# Patient Record
Sex: Female | Born: 1995 | Hispanic: Yes | Marital: Married | Smoking: Former smoker
Health system: Southern US, Community
[De-identification: ages and names within clinical notes are randomized; demographics above are authoritative.]

## PROBLEM LIST (undated history)

## (undated) DIAGNOSIS — F419 Anxiety disorder, unspecified: Secondary | ICD-10-CM

## (undated) HISTORY — PX: UMBILICAL HERNIA REPAIR: SHX196

---

## 2018-01-30 ENCOUNTER — Other Ambulatory Visit: Payer: Self-pay

## 2018-01-30 ENCOUNTER — Emergency Department (HOSPITAL_COMMUNITY)
Admission: EM | Admit: 2018-01-30 | Discharge: 2018-01-30 | Disposition: A | Discharge status: Home / Self Care | Payer: Self-pay | Attending: Emergency Medicine | Admitting: Emergency Medicine

## 2018-01-30 ENCOUNTER — Encounter (HOSPITAL_COMMUNITY): Payer: Self-pay | Admitting: Emergency Medicine

## 2018-01-30 DIAGNOSIS — F17210 Nicotine dependence, cigarettes, uncomplicated: Secondary | ICD-10-CM | POA: Insufficient documentation

## 2018-01-30 DIAGNOSIS — R200 Anesthesia of skin: Secondary | ICD-10-CM | POA: Insufficient documentation

## 2018-01-30 DIAGNOSIS — M25511 Pain in right shoulder: Secondary | ICD-10-CM | POA: Insufficient documentation

## 2018-01-30 DIAGNOSIS — M436 Torticollis: Secondary | ICD-10-CM | POA: Insufficient documentation

## 2018-01-30 MED ORDER — IBUPROFEN 800 MG PO TABS
800.0000 mg | ORAL_TABLET | Freq: Once | ORAL | Status: AC
  Administered 2018-01-30: 800 mg via ORAL
  Filled 2018-01-30: qty 1

## 2018-01-30 MED ORDER — TRAMADOL HCL 50 MG PO TABS: 50.0000 mg | ORAL_TABLET | Freq: Four times a day (QID) | ORAL | 0 refills | Status: DC | PRN

## 2018-01-30 MED ORDER — CYCLOBENZAPRINE HCL 10 MG PO TABS: 10.0000 mg | ORAL_TABLET | Freq: Three times a day (TID) | ORAL | 0 refills | Status: DC | PRN

## 2018-01-30 MED ORDER — CYCLOBENZAPRINE HCL 10 MG PO TABS
10.0000 mg | ORAL_TABLET | Freq: Once | ORAL | Status: AC
Start: 2018-01-30 — End: 2018-01-30
  Administered 2018-01-30: 10 mg via ORAL
  Filled 2018-01-30: qty 1

## 2018-01-30 NOTE — ED Provider Notes (Signed)
[facility]-EMERGENCY DEPT Provider Note   CSN: [id number] Arrival date & time: 01/30/18  0103     History   Chief Complaint Chief Complaint  Patient presents with  . Neck Pain    HPI Stacey Tapia is a 22 y.o. female.  The history is provided by the patient. A language interpreter was used.  She complains of pain in the right side of her neck for the last week.  Pain radiates to the right shoulder and there is some numbness in the proximal right upper arm.  Pain has been getting worse.  She rates pain at 5/10.  Nothing makes it better, nothing makes it worse.  She denies any weakness.  She denies any trauma or unusual activity.  She is never had anything like this before.  She has taken p.o. scopolamine with acetaminophen, but got no relief with this.  History reviewed. No pertinent past medical history.  There are no active problems to display for this patient.   History reviewed. No pertinent surgical history.   OB History   None      Home Medications    Prior to Admission medications   Not on File    Family History History reviewed. No pertinent family history.  Social History Social History   Tobacco Use  . Smoking status: Current Some Day Smoker    Packs/day: 0.25    Types: Cigarettes  . Smokeless tobacco: Never Used  Substance Use Topics  . Alcohol use: Never    Frequency: Never  . Drug use: Never     Allergies   Cough syrup [guaifenesin]   Review of Systems Review of Systems  All other systems reviewed and are negative.    Physical Exam Updated Vital Signs BP 103/75   Pulse 71   Temp 98.7 F (37.1 C) (Oral)   Resp 18   Ht 5' 4.57" (1.64 m)   Wt 74.8 kg (165 lb)   LMP 01/16/2018   SpO2 100%   BMI 27.83 kg/m   Physical Exam  Nursing note and vitals reviewed.  22 year old female, resting comfortably and in no acute distress. Vital signs are normal. Oxygen saturation is 100%, which is normal. Head is  normocephalic and atraumatic. PERRLA, EOMI. Oropharynx is clear. Neck has significant spasm of the right paracervical muscles with tenderness at the same area.  There is no meningismus.  Full range of motion is present.  There is no adenopathy or JVD. Back is nontender and there is no CVA tenderness. Lungs are clear without rales, wheezes, or rhonchi. Chest is nontender. Heart has regular rate and rhythm without murmur. Abdomen is soft, flat, nontender without masses or hepatosplenomegaly and peristalsis is normoactive. Extremities have no cyanosis or edema, full range of motion is present. Skin is warm and dry without rash. Neurologic: Mental status is normal, cranial nerves are intact, there are no motor or sensory deficits.  Strength is 5/5 in all muscles of the right arm.  ED Treatments / Results   Procedures Procedures (including critical care time)  Medications Ordered in ED Medications  ibuprofen (ADVIL,MOTRIN) tablet 800 mg (has no administration in time range)  cyclobenzaprine (FLEXERIL) tablet 10 mg (has no administration in time range)     Initial Impression / Assessment and Plan / ED Course  I have reviewed the triage vital signs and the nursing notes.  Right-sided neck pain consistent with torticollis.  No red flags to suggest more serious pathology.  She is discharged  with prescriptions for tramadol and cyclobenzaprine, recommended she use over-the-counter NSAIDs as needed.  Return precautions discussed.  Final Clinical Impressions(s) / ED Diagnoses   Final diagnoses:  Torticollis, acute    ED Discharge Orders        Ordered    cyclobenzaprine (FLEXERIL) 10 MG tablet  3 times daily PRN     01/30/18 0351    traMADol (ULTRAM) 50 MG tablet  Every 6 hours PRN     01/30/18 0351       Dione Booze, MD 01/30/18 [phone]

## 2018-01-30 NOTE — Discharge Instructions (Addendum)
Aplicar varias veces al da.  [employer] dos tabletas de naproxeno (Aleve) dos veces al da.  Alternativamente, tome tres ibuprofeno (Advil, Motrin) cuatro veces al [employer].

## 2018-01-30 NOTE — ED Triage Notes (Signed)
Pt family reports that she has been having pain right side of neck and shooting pain into right arm for the last week. Pt also reporting swelling to right hand.

## 2021-04-20 ENCOUNTER — Emergency Department (HOSPITAL_COMMUNITY)
Admission: EM | Admit: 2021-04-20 | Discharge: 2021-04-20 | Disposition: A | Discharge status: Home / Self Care | Payer: Self-pay | Attending: Emergency Medicine | Admitting: Emergency Medicine

## 2021-04-20 ENCOUNTER — Encounter (HOSPITAL_COMMUNITY): Payer: Self-pay | Admitting: *Deleted

## 2021-04-20 ENCOUNTER — Emergency Department (HOSPITAL_COMMUNITY): Payer: Self-pay

## 2021-04-20 ENCOUNTER — Other Ambulatory Visit: Payer: Self-pay

## 2021-04-20 DIAGNOSIS — R059 Cough, unspecified: Secondary | ICD-10-CM | POA: Insufficient documentation

## 2021-04-20 DIAGNOSIS — Z5321 Procedure and treatment not carried out due to patient leaving prior to being seen by health care provider: Secondary | ICD-10-CM | POA: Insufficient documentation

## 2021-04-20 DIAGNOSIS — F17210 Nicotine dependence, cigarettes, uncomplicated: Secondary | ICD-10-CM | POA: Insufficient documentation

## 2021-04-20 DIAGNOSIS — R202 Paresthesia of skin: Secondary | ICD-10-CM | POA: Insufficient documentation

## 2021-04-20 DIAGNOSIS — R0981 Nasal congestion: Secondary | ICD-10-CM | POA: Insufficient documentation

## 2021-04-20 DIAGNOSIS — F419 Anxiety disorder, unspecified: Secondary | ICD-10-CM | POA: Insufficient documentation

## 2021-04-20 DIAGNOSIS — R290 Tetany: Secondary | ICD-10-CM

## 2021-04-20 DIAGNOSIS — R064 Hyperventilation: Secondary | ICD-10-CM | POA: Insufficient documentation

## 2021-04-20 DIAGNOSIS — R0602 Shortness of breath: Secondary | ICD-10-CM | POA: Insufficient documentation

## 2021-04-20 DIAGNOSIS — R252 Cramp and spasm: Secondary | ICD-10-CM | POA: Insufficient documentation

## 2021-04-20 LAB — CBC WITH DIFFERENTIAL/PLATELET
Abs Immature Granulocytes: 0 10*3/uL (ref 0.00–0.07)
Basophils Absolute: 0 10*3/uL (ref 0.0–0.1)
Basophils Relative: 0 %
Eosinophils Absolute: 0 10*3/uL (ref 0.0–0.5)
Eosinophils Relative: 0 %
HCT: 41.2 % (ref 36.0–46.0)
Hemoglobin: 13.6 g/dL (ref 12.0–15.0)
Immature Granulocytes: 0 %
Lymphocytes Relative: 56 %
Lymphs Abs: 3.7 10*3/uL (ref 0.7–4.0)
MCH: 29.6 pg (ref 26.0–34.0)
MCHC: 33 g/dL (ref 30.0–36.0)
MCV: 89.8 fL (ref 80.0–100.0)
Monocytes Absolute: 0.6 10*3/uL (ref 0.1–1.0)
Monocytes Relative: 10 %
Neutro Abs: 2.2 10*3/uL (ref 1.7–7.7)
Neutrophils Relative %: 34 %
Platelets: 222 10*3/uL (ref 150–400)
RBC: 4.59 MIL/uL (ref 3.87–5.11)
RDW: 13 % (ref 11.5–15.5)
WBC: 6.5 10*3/uL (ref 4.0–10.5)
nRBC: 0 % (ref 0.0–0.2)

## 2021-04-20 LAB — COMPREHENSIVE METABOLIC PANEL
ALT: 39 U/L (ref 0–44)
AST: 42 U/L — ABNORMAL HIGH (ref 15–41)
Albumin: 3.9 g/dL (ref 3.5–5.0)
Alkaline Phosphatase: 42 U/L (ref 38–126)
Anion gap: 5 (ref 5–15)
BUN: 19 mg/dL (ref 6–20)
CO2: 24 mmol/L (ref 22–32)
Calcium: 8.6 mg/dL — ABNORMAL LOW (ref 8.9–10.3)
Chloride: 109 mmol/L (ref 98–111)
Creatinine, Ser: 0.51 mg/dL (ref 0.44–1.00)
GFR, Estimated: >60 mL/min (ref >60)
Glucose, Bld: 90 mg/dL (ref 70–99)
Potassium: 4 mmol/L (ref 3.5–5.1)
Sodium: 138 mmol/L (ref 135–145)
Total Bilirubin: 0.2 mg/dL — ABNORMAL LOW (ref 0.3–1.2)
Total Protein: 6.7 g/dL (ref 6.5–8.1)

## 2021-04-20 LAB — I-STAT BETA HCG BLOOD, ED (MC, WL, AP ONLY): I-stat hCG, quantitative: <5 m[IU]/mL (ref <5)

## 2021-04-20 LAB — CK: Total CK: 106 U/L (ref 38–234)

## 2021-04-20 MED ORDER — IBUPROFEN 200 MG PO TABS
400.0000 mg | ORAL_TABLET | Freq: Once | ORAL | Status: AC
  Administered 2021-04-20: 06:00:00 400 mg via ORAL
  Filled 2021-04-20: qty 2

## 2021-04-20 NOTE — ED Triage Notes (Signed)
The pt is breathing 40 times a minute still she does not think its her breathing her fingers are still all cramped up  she reports that she cannot feel her face or the rest of her body

## 2021-04-20 NOTE — ED Notes (Signed)
The female with the pt was asking how long before they see a doctor. He  Reports that the pt has been breathing fast for one hour.  I decided to draw blood on her although she had no pain anywhere.   When she was called the female had left with her before the blood was drawn

## 2021-04-20 NOTE — ED Triage Notes (Signed)
Pt came in with c/o tachypnea (at home and [facility]), hand, and feet cramping. Pt's hands and feet appear tense. Pt was seen at [facility] previously and they educated her to slow her breathing down. Pt's RR rate back to normal but c/o pain

## 2021-04-20 NOTE — ED Provider Notes (Signed)
[facility]-EMERGENCY DEPT Provider Note   CSN: [id number] Arrival date & time: 04/20/21  0151     History Chief Complaint  Patient presents with   Anxiety   Hand Pain    Stacey Tapia is a 25 y.o. female.  The history is provided by the patient. No language interpreter was used.  Anxiety Associated symptoms include shortness of breath.  Hand Pain Associated symptoms include shortness of breath. Patient presents for multiple complaints. Patient speaks English and declines language interpreter Patient reports that she was recently diagnosed with COVID over 4 days ago.  She reports recent cough and congestion.  She reports earlier night, she started feeling short of breath and breathing very fast.  She then reported numbness throughout her hands and into her face.  She reports her hands both began to cramp.  She went to [facility], because she was not seen immediately they left that hospital and came immediately to [facility] She is now beginning to improve.  She does report feeling anxious.     PMH-none OB History   No obstetric history on file.     No family history on file.  Social History   Tobacco Use   Smoking status: Some Days    Packs/day: 0.25    Pack years: 0.00    Types: Cigarettes   Smokeless tobacco: Never  Substance Use Topics   Alcohol use: Never   Drug use: Never    Home Medications Prior to Admission medications   Medication Sig Start Date End Date Taking? Authorizing Provider  cyclobenzaprine (FLEXERIL) 10 MG tablet Take 1 tablet (10 mg total) by mouth 3 (three) times daily as needed for muscle spasms. 01/30/18   Dione Booze, MD  traMADol (ULTRAM) 50 MG tablet Take 1 tablet (50 mg total) by mouth every 6 (six) hours as needed. 01/30/18   Dione Booze, MD    Allergies    Cough syrup [guaifenesin]  Review of Systems   Review of Systems  Constitutional:  Negative for fever.  Respiratory:  Positive  for cough and shortness of breath.   Musculoskeletal:  Positive for myalgias.  Psychiatric/Behavioral:  The patient is nervous/anxious.   All other systems reviewed and are negative.  Physical Exam Updated Vital Signs BP 98/84   Pulse (!) 54   Temp 97.6 F (36.4 C) (Oral)   Resp 15   Ht 1.626 m (5\' 4" )   Wt 74.4 kg   SpO2 100%   BMI 28.15 kg/m   Physical Exam CONSTITUTIONAL: Well developed/well nourished, mildly anxious HEAD: Normocephalic/atraumatic EYES: EOMI/PERRL ENMT: Mucous membranes moist NECK: supple no meningeal signs SPINE/BACK:entire spine nontender CV: S1/S2 noted, no murmurs/rubs/gallops noted LUNGS: Mild tachypnea, no acute distress noted, no crackles ABDOMEN: soft, nontender NEURO: Pt is awake/alert/appropriate, moves all extremitiesx4.  No facial droop.  No arm or leg drift.  No tremors are noted.  No facial spasms noted EXTREMITIES: pulses normal/equal, full ROM, patient has left hand with a closed fist, I'm able to open her hand without difficulty Left thigh is mildly tender to palpation, but no bruising or crepitus.  Distal pulses equal and intact.  No calf tenderness.  Female chaperone present for thigh exam SKIN: warm, color normal PSYCH: Mildly anxious  ED Results / Procedures / Treatments   Labs (all labs ordered are listed, but only abnormal results are displayed) Labs Reviewed  COMPREHENSIVE METABOLIC PANEL - Abnormal; Notable for the following components:      Result  Value   Calcium 8.6 (*)    AST 42 (*)    Total Bilirubin 0.2 (*)    All other components within normal limits  CBC WITH DIFFERENTIAL/PLATELET  CK  I-STAT BETA HCG BLOOD, ED ([facility], [facility], [facility] ONLY)    EKG EKG Interpretation  Date/Time:  Monday April 20 2021 04:03:10 EDT Ventricular Rate:  57 PR Interval:  130 QRS Duration: 90 QT Interval:  418 QTC Calculation: 407 R Axis:   75 Text Interpretation: Sinus rhythm No previous ECGs available Confirmed by Zadie Rhine ([id number]) on  04/20/2021 4:16:52 AM  Radiology DG Chest Port 1 View  Result Date: 04/20/2021 CLINICAL DATA:  Tachypnea EXAM: PORTABLE CHEST 1 VIEW COMPARISON:  None. FINDINGS: Normal heart size and mediastinal contours. No acute infiltrate or edema. No effusion or pneumothorax. No acute osseous findings. IMPRESSION: Negative chest. Electronically Signed   By: Marnee Spring M.D.   On: 04/20/2021 04:44    Procedures Procedures   Medications Ordered in ED Medications  ibuprofen (ADVIL) tablet 400 mg (400 mg Oral Given 04/20/21 0539)    ED Course  I have reviewed the triage vital signs and the nursing notes.  Pertinent labs & imaging results that were available during my care of the patient were reviewed by me and considered in my medical decision making (see chart for details).    MDM Rules/Calculators/A&P                          Patient reports she started feeling shortness of breath at home and thought it was due to COVID.  Apparently she was hyperventilating, and reported numbness throughout her face and had cramping and spasms in both hands.  By the time she reached this hospital, things were improving.  She also reported pain and cramping in the left thigh.  Overall patient appears improved.  Labs are not significantly deranged, potassium and calcium around the normal range.  Suspect spasms and cramping are due to recent hyperventilation and will likely resolve in next several hours.  Patient is ambulatory.  Overall appears appropriate for discharge home Final Clinical Impression(s) / ED Diagnoses Final diagnoses:  Shortness of breath  Carpopedal spasm    Rx / DC Orders ED Discharge Orders     None        Zadie Rhine, MD 04/20/21 [phone]

## 2021-04-20 NOTE — ED Notes (Signed)
PT WANTED MEDICINE AND CANNOT RECEIVE THOSE IN TRIAGE SO PT LEFT.

## 2021-04-20 NOTE — ED Triage Notes (Signed)
The pt is hyperventilating there is a female with her hthat may be causing her panic attack  she has cramps in her finfers and she has been breathing like this for at least 30 minutes. Crying

## 2021-05-15 ENCOUNTER — Inpatient Hospital Stay (HOSPITAL_COMMUNITY): Payer: Self-pay

## 2021-05-15 ENCOUNTER — Encounter (HOSPITAL_COMMUNITY): Payer: Self-pay | Admitting: Obstetrics and Gynecology

## 2021-05-15 ENCOUNTER — Other Ambulatory Visit: Payer: Self-pay

## 2021-05-15 ENCOUNTER — Inpatient Hospital Stay (HOSPITAL_COMMUNITY)
Admission: AD | Admit: 2021-05-15 | Discharge: 2021-05-15 | Disposition: A | Discharge status: Home / Self Care | Payer: Self-pay | Attending: Obstetrics and Gynecology | Admitting: Obstetrics and Gynecology

## 2021-05-15 DIAGNOSIS — O209 Hemorrhage in early pregnancy, unspecified: Secondary | ICD-10-CM | POA: Insufficient documentation

## 2021-05-15 DIAGNOSIS — Z3A01 Less than 8 weeks gestation of pregnancy: Secondary | ICD-10-CM | POA: Insufficient documentation

## 2021-05-15 DIAGNOSIS — Z3490 Encounter for supervision of normal pregnancy, unspecified, unspecified trimester: Secondary | ICD-10-CM

## 2021-05-15 DIAGNOSIS — O210 Mild hyperemesis gravidarum: Secondary | ICD-10-CM

## 2021-05-15 DIAGNOSIS — O4691 Antepartum hemorrhage, unspecified, first trimester: Secondary | ICD-10-CM

## 2021-05-15 DIAGNOSIS — O99331 Smoking (tobacco) complicating pregnancy, first trimester: Secondary | ICD-10-CM | POA: Insufficient documentation

## 2021-05-15 DIAGNOSIS — R1030 Lower abdominal pain, unspecified: Secondary | ICD-10-CM | POA: Insufficient documentation

## 2021-05-15 DIAGNOSIS — O219 Vomiting of pregnancy, unspecified: Secondary | ICD-10-CM | POA: Insufficient documentation

## 2021-05-15 DIAGNOSIS — O26891 Other specified pregnancy related conditions, first trimester: Secondary | ICD-10-CM | POA: Insufficient documentation

## 2021-05-15 DIAGNOSIS — F17210 Nicotine dependence, cigarettes, uncomplicated: Secondary | ICD-10-CM | POA: Insufficient documentation

## 2021-05-15 HISTORY — DX: Anxiety disorder, unspecified: F41.9

## 2021-05-15 LAB — COMPREHENSIVE METABOLIC PANEL
ALT: 21 U/L (ref 0–44)
AST: 19 U/L (ref 15–41)
Albumin: 3.7 g/dL (ref 3.5–5.0)
Alkaline Phosphatase: 40 U/L (ref 38–126)
Anion gap: 7 (ref 5–15)
BUN: 12 mg/dL (ref 6–20)
CO2: 25 mmol/L (ref 22–32)
Calcium: 8.9 mg/dL (ref 8.9–10.3)
Chloride: 103 mmol/L (ref 98–111)
Creatinine, Ser: 0.62 mg/dL (ref 0.44–1.00)
GFR, Estimated: >60 mL/min (ref >60)
Glucose, Bld: 84 mg/dL (ref 70–99)
Potassium: 3.9 mmol/L (ref 3.5–5.1)
Sodium: 135 mmol/L (ref 135–145)
Total Bilirubin: 0.8 mg/dL (ref 0.3–1.2)
Total Protein: 6.4 g/dL — ABNORMAL LOW (ref 6.5–8.1)

## 2021-05-15 LAB — CBC
HCT: 39 % (ref 36.0–46.0)
Hemoglobin: 13 g/dL (ref 12.0–15.0)
MCH: 30 pg (ref 26.0–34.0)
MCHC: 33.3 g/dL (ref 30.0–36.0)
MCV: 89.9 fL (ref 80.0–100.0)
Platelets: 263 10*3/uL (ref 150–400)
RBC: 4.34 MIL/uL (ref 3.87–5.11)
RDW: 12.8 % (ref 11.5–15.5)
WBC: 9.1 10*3/uL (ref 4.0–10.5)
nRBC: 0 % (ref 0.0–0.2)

## 2021-05-15 LAB — URINALYSIS, ROUTINE W REFLEX MICROSCOPIC
Bilirubin Urine: NEGATIVE
Glucose, UA: NEGATIVE mg/dL
Hgb urine dipstick: NEGATIVE
Ketones, ur: 20 mg/dL — AB
Leukocytes,Ua: NEGATIVE
Nitrite: NEGATIVE
Protein, ur: NEGATIVE mg/dL
Specific Gravity, Urine: 1.013 (ref 1.005–1.030)
pH: 7 (ref 5.0–8.0)

## 2021-05-15 LAB — HCG, QUANTITATIVE, PREGNANCY: hCG, Beta Chain, Quant, S: 63263 m[IU]/mL — ABNORMAL HIGH (ref <5)

## 2021-05-15 MED ORDER — PROMETHAZINE HCL 12.5 MG PO TABS: 12.5000 mg | ORAL_TABLET | Freq: Four times a day (QID) | ORAL | 0 refills | Status: DC | PRN

## 2021-05-15 NOTE — MAU Note (Signed)
Stacey Tapia is a 25 y.o. at [redacted]w[redacted]d here in MAU reporting: lower abdominal pain and a little bit of vaginal bleeding. Both started on Wednesday. Bleeding is now brown.  Pt had pregnancy confirmed at [facility] and has paperwork with her.  LMP: 03/04/21, has irregular periods  Onset of complaint: ongoing  Pain score: 5/10  Vitals:   05/15/21 1008  BP: 126/60  Pulse: 87  Resp: 16  Temp: 98.9 F (37.2 C)  SpO2: 99%     Lab orders placed from triage: UA

## 2021-05-15 NOTE — Discharge Instructions (Signed)

## 2021-05-15 NOTE — MAU Provider Note (Signed)
History     CSN: [id number]  Arrival date and time: 05/15/21 0955   Event Date/Time   First Provider Initiated Contact with Patient 05/15/21 1026      Chief Complaint  Patient presents with   Abdominal Pain   Vaginal Bleeding   HPI Stacey Tapia is a 25 y.o. G1P0 in early pregnancy who presents to MAU with chief complaints of lower abdominal pain and vaginal spotting. These are new problems, onset one week ago.. Patient's pain is across her entire lower abdomen. Pain score is 5/10. She denies aggravating or alleviating factors. She has not taken medication or tried other treatments for this complaint. She declines medication during MAU evaluation.  Patient's spotting is very light. She denies bright red or heavy vaginal bleeding. She visualizes the spotting when she wipes after voiding. She denies dysuria, abdominal tenderness, fever. She is remote from sexual intercourse.  Patient endorses nausea and with occasional vomiting. She is able to tolerate PO intake.   Patient is planning care with [facility].  OB History     Gravida  1   Para      Term      Preterm      AB      Living         SAB      IAB      Ectopic      Multiple      Live Births              Past Medical History:  Diagnosis Date   Anxiety     Past Surgical History:  Procedure Laterality Date   UMBILICAL HERNIA REPAIR      History reviewed. No pertinent family history.  Social History   Tobacco Use   Smoking status: Some Days    Packs/day: 0.25    Pack years: 0.00    Types: Cigarettes   Smokeless tobacco: Never  Substance Use Topics   Alcohol use: Never   Drug use: Never    Allergies:  Allergies  Allergen Reactions   Cough Syrup [Guaifenesin]     Tachycardia, actual medication was Tox-Sol    No medications prior to admission.    Review of Systems  Gastrointestinal:  Positive for abdominal pain.  Genitourinary:  Positive for vaginal bleeding.  All other systems  reviewed and are negative. Physical Exam   Blood pressure 126/60, pulse 87, temperature 98.9 F (37.2 C), temperature source Oral, resp. rate 16, height 5\' 4"  (1.626 m), weight 49.4 kg, last menstrual period 03/04/2021, SpO2 99 %.  Physical Exam Vitals and nursing note reviewed. Exam conducted with a chaperone present.  Constitutional:      General: She is not in acute distress.    Appearance: She is well-developed. She is not ill-appearing.  Cardiovascular:     Rate and Rhythm: Normal rate and regular rhythm.     Heart sounds: Normal heart sounds.  Pulmonary:     Effort: Pulmonary effort is normal.     Breath sounds: Normal breath sounds.  Abdominal:     General: Bowel sounds are normal.     Palpations: Abdomen is soft.     Tenderness: There is no abdominal tenderness. There is no right CVA tenderness or left CVA tenderness.  Genitourinary:    Comments: Deferred due to ROS and reassuring imaging and lab results in MAU Skin:    General: Skin is warm and dry.     Capillary Refill: Capillary refill takes less than  2 seconds.  Neurological:     Mental Status: She is alert and oriented to person, place, and time.  Psychiatric:        Mood and Affect: Mood normal.        Behavior: Behavior normal.    MAU Course  Procedures  --Patient has hand carried records from pregnancy confirmation visit with outside clinic. Blood type A POS  --Nausea with occasional vomiting. Mild ketonuria. Will prescribed Phenergan as new ob appointment isn't until end of month and symptoms may intensify before then  Orders Placed This Encounter  Procedures   US OB LESS THAN 14 WEEKS WITH OB TRANSVAGINAL   Urinalysis, Routine w reflex microscopic Urine, Clean Catch   CBC   Comprehensive metabolic panel   hCG, quantitative, pregnancy   Discharge patient   Patient Vitals for the past 24 hrs:  BP Temp Temp src Pulse Resp SpO2 Height Weight  05/15/21 1230 116/60 -- -- 89 -- -- -- --  05/15/21 1008  126/60 98.9 F (37.2 C) Oral 87 16 99 % 5\' 4"  (1.626 m) 49.4 kg   Results for orders placed or performed during the hospital encounter of 05/15/21 (from the past 24 hour(s))  CBC     Status: None   Collection Time: 05/15/21 10:49 AM  Result Value Ref Range   WBC 9.1 4.0 - 10.5 K/uL   RBC 4.34 3.87 - 5.11 MIL/uL   Hemoglobin 13.0 12.0 - 15.0 g/dL   HCT 07/16/21 10.2 - 72.5 %   MCV 89.9 80.0 - 100.0 fL   MCH 30.0 26.0 - 34.0 pg   MCHC 33.3 30.0 - 36.0 g/dL   RDW 36.6 44.0 - 34.7 %   Platelets 263 150 - 400 K/uL   nRBC 0.0 0.0 - 0.2 %  Comprehensive metabolic panel     Status: Abnormal   Collection Time: 05/15/21 10:49 AM  Result Value Ref Range   Sodium 135 135 - 145 mmol/L   Potassium 3.9 3.5 - 5.1 mmol/L   Chloride 103 98 - 111 mmol/L   CO2 25 22 - 32 mmol/L   Glucose, Bld 84 70 - 99 mg/dL   BUN 12 6 - 20 mg/dL   Creatinine, Ser 07/16/21 0.44 - 1.00 mg/dL   Calcium 8.9 8.9 - 9.56 mg/dL   Total Protein 6.4 (L) 6.5 - 8.1 g/dL   Albumin 3.7 3.5 - 5.0 g/dL   AST 19 15 - 41 U/L   ALT 21 0 - 44 U/L   Alkaline Phosphatase 40 38 - 126 U/L   Total Bilirubin 0.8 0.3 - 1.2 mg/dL   GFR, Estimated 38.7 >56 mL/min   Anion gap 7 5 - 15  hCG, quantitative, pregnancy     Status: Abnormal   Collection Time: 05/15/21 10:49 AM  Result Value Ref Range   hCG, Beta Chain, Quant, S 63,263 (H) <5 mIU/mL  Urinalysis, Routine w reflex microscopic Urine, Clean Catch     Status: Abnormal   Collection Time: 05/15/21 12:11 PM  Result Value Ref Range   Color, Urine YELLOW YELLOW   APPearance CLEAR CLEAR   Specific Gravity, Urine 1.013 1.005 - 1.030   pH 7.0 5.0 - 8.0   Glucose, UA NEGATIVE NEGATIVE mg/dL   Hgb urine dipstick NEGATIVE NEGATIVE   Bilirubin Urine NEGATIVE NEGATIVE   Ketones, ur 20 (A) NEGATIVE mg/dL   Protein, ur NEGATIVE NEGATIVE mg/dL   Nitrite NEGATIVE NEGATIVE   Leukocytes,Ua NEGATIVE NEGATIVE   07/16/21 OB LESS THAN  14 WEEKS WITH OB TRANSVAGINAL  Result Date: 05/15/2021 CLINICAL DATA:   Abdominal pain for 1 week. Clinical gestational age of [redacted] weeks and 2 days. EXAM: OBSTETRIC <14 WK [place] AND TRANSVAGINAL OB US TECHNIQUE: Both transabdominal and transvaginal ultrasound examinations were performed for complete evaluation of the gestation as well as the maternal uterus, adnexal regions, and pelvic cul-de-sac. Transvaginal technique was performed to assess early pregnancy. COMPARISON:  None. FINDINGS: Intrauterine gestational sac: Single Yolk sac:  Visualized Embryo:  Visualized Cardiac Activity: Visualized Heart Rate: 115 bpm CRL:  2.4 mm   5 w   5 d                  [place] EDC: 01/10/2022 Subchorionic hemorrhage:  None visualized. Maternal uterus/adnexae: Right ovary: Normal containing corpus luteum Left ovary: Normal Other :None Free fluid:  Trace IMPRESSION: 1. Single living intrauterine gestation with an estimated gestational age of [redacted] weeks and 5 days. Note: The gestational age by ultrasound is discordant with the clinical gestational age of [redacted] weeks and 2 days. Electronically Signed   By: Signa Kell M.D.   On: 05/15/2021 11:47    Meds ordered this encounter  Medications   promethazine (PHENERGAN) 12.5 MG tablet    Sig: Take 1 tablet (12.5 mg total) by mouth every 6 (six) hours as needed for nausea or vomiting.    Dispense:  30 tablet    Refill:  0    Order Specific Question:   Supervising Provider    Answer:   Onarga Bing [[id number]]   Assessment and Plan  --24 y.o. G1P0 with live IUP at [redacted]w[redacted]d  --Nausea and vomiting improving, Phenergan prescribed PRN --Hgb 13.0 --Blood type A POS --Limited english fluency, has signed consent to have husband serve as interpreter --Discharge home in stable condition with first trimester precautions  F/U: --Patient has New OB scheduled with [facility] end of July  Calvert Cantor, [place] 05/15/2021, 5:19 PM

## 2021-05-25 ENCOUNTER — Inpatient Hospital Stay (HOSPITAL_COMMUNITY)
Admission: AD | Admit: 2021-05-25 | Discharge: 2021-05-25 | Disposition: A | Discharge status: Home / Self Care | Payer: Self-pay | Attending: Obstetrics & Gynecology | Admitting: Obstetrics & Gynecology

## 2021-05-25 ENCOUNTER — Other Ambulatory Visit: Payer: Self-pay

## 2021-05-25 DIAGNOSIS — Z3A01 Less than 8 weeks gestation of pregnancy: Secondary | ICD-10-CM

## 2021-05-25 DIAGNOSIS — O99331 Smoking (tobacco) complicating pregnancy, first trimester: Secondary | ICD-10-CM | POA: Insufficient documentation

## 2021-05-25 DIAGNOSIS — Z3491 Encounter for supervision of normal pregnancy, unspecified, first trimester: Secondary | ICD-10-CM

## 2021-05-25 DIAGNOSIS — O4691 Antepartum hemorrhage, unspecified, first trimester: Secondary | ICD-10-CM

## 2021-05-25 DIAGNOSIS — Z3A10 10 weeks gestation of pregnancy: Secondary | ICD-10-CM | POA: Insufficient documentation

## 2021-05-25 DIAGNOSIS — F17210 Nicotine dependence, cigarettes, uncomplicated: Secondary | ICD-10-CM | POA: Insufficient documentation

## 2021-05-25 DIAGNOSIS — O209 Hemorrhage in early pregnancy, unspecified: Secondary | ICD-10-CM | POA: Insufficient documentation

## 2021-05-25 LAB — URINALYSIS, ROUTINE W REFLEX MICROSCOPIC
Bilirubin Urine: NEGATIVE
Glucose, UA: NEGATIVE mg/dL
Ketones, ur: NEGATIVE mg/dL
Leukocytes,Ua: NEGATIVE
Nitrite: NEGATIVE
Protein, ur: NEGATIVE mg/dL
Specific Gravity, Urine: 1.016 (ref 1.005–1.030)
pH: 7 (ref 5.0–8.0)

## 2021-05-25 LAB — WET PREP, GENITAL
Clue Cells Wet Prep HPF POC: NONE SEEN
Sperm: NONE SEEN
Trich, Wet Prep: NONE SEEN
Yeast Wet Prep HPF POC: NONE SEEN

## 2021-05-25 NOTE — MAU Provider Note (Addendum)
[facility]ulty [facility] OB/GYN Attending MAU Note  Chief Complaint: Vaginal Bleeding    Event Date/Time   First Provider Initiated Contact with Patient 05/25/21 2050      SUBJECTIVE Stacey Tapia is a 25 y.o. G1P0 at [redacted]w[redacted]d by LMP who presents with report of vaginal bleeding for a few days. Already has confirmed IUP. Mild cramping. Scant amount of bleeding currently.   Denies any abnormal vaginal discharge, fevers, chills, sweats, dysuria, nausea, vomiting, other GI or GU symptoms or other general symptoms.   Past Medical History:  Diagnosis Date   Anxiety    OB History  Gravida Para Term Preterm AB Living  1            SAB IAB Ectopic Multiple Live Births               # Outcome Date GA Lbr Len/2nd Weight Sex Delivery Anes PTL Lv  1 Current            Past Surgical History:  Procedure Laterality Date   UMBILICAL HERNIA REPAIR     Social History   Socioeconomic History   Marital status: Married    Spouse name: Not on file   Number of children: Not on file   Years of education: Not on file   Highest education level: Not on file  Occupational History   Not on file  Tobacco Use   Smoking status: Some Days    Packs/day: 0.25    Types: Cigarettes   Smokeless tobacco: Never  Substance and Sexual Activity   Alcohol use: Never   Drug use: Never   Sexual activity: Not on file  Other Topics Concern   Not on file  Social History Narrative   Not on file   Social Determinants of Health   Financial Resource Strain: Not on file  Food Insecurity: Not on file  Transportation Needs: Not on file  Physical Activity: Not on file  Stress: Not on file  Social Connections: Not on file  Intimate Partner Violence: Not on file   No current facility-administered medications on file prior to encounter.   Current Outpatient Medications on File Prior to Encounter  Medication Sig Dispense Refill   promethazine (PHENERGAN) 12.5 MG tablet Take 1 tablet (12.5 mg total) by mouth every 6  (six) hours as needed for nausea or vomiting. 30 tablet 0   Allergies  Allergen Reactions   Cough Syrup [Guaifenesin]     Tachycardia, actual medication was Tox-Sol    ROS: Pertinent items in HPI  OBJECTIVE BP (!) 115/57 (BP Location: Right Arm)   Temp 98.7 F (37.1 C) (Oral)   Ht 5\' 4"  (1.626 m)   Wt 54.2 kg   LMP 03/04/2021 (Approximate)   SpO2 100%   BMI 20.49 kg/m  CONSTITUTIONAL: Well-developed, well-nourished female in no acute distress.  SKIN: Skin is warm and dry. No rash noted. Not diaphoretic. No erythema. No pallor. NEUROLGIC: Alert and oriented to person, place, and time. Normal reflexes, muscle tone coordination. No cranial nerve deficit noted. PSYCHIATRIC: Normal mood and affect. Normal behavior. Normal judgment and thought content. CARDIOVASCULAR: Normal heart rate noted RESPIRATORY: Effort and breath sounds normal, no problems with respiration noted. ABDOMEN: Soft, no distention noted.  No tenderness, rebound or guarding.  PELVIC:Deferred. Patient did self-swabs.  MUSCULOSKELETAL: Normal range of motion. No tenderness.  No cyanosis, clubbing, or edema.  2+ distal pulses.  LAB RESULTS Results for orders placed or performed during the hospital encounter of 05/25/21 (from the  past 48 hour(s))  Urinalysis, Routine w reflex microscopic Urine, Clean Catch     Status: Abnormal   Collection Time: 05/25/21  6:33 PM  Result Value Ref Range   Color, Urine YELLOW YELLOW   APPearance CLEAR CLEAR   Specific Gravity, Urine 1.016 1.005 - 1.030   pH 7.0 5.0 - 8.0   Glucose, UA NEGATIVE NEGATIVE mg/dL   Hgb urine dipstick MODERATE (A) NEGATIVE   Bilirubin Urine NEGATIVE NEGATIVE   Ketones, ur NEGATIVE NEGATIVE mg/dL   Protein, ur NEGATIVE NEGATIVE mg/dL   Nitrite NEGATIVE NEGATIVE   Leukocytes,Ua NEGATIVE NEGATIVE   RBC / HPF 0-5 0 - 5 RBC/hpf   WBC, UA 0-5 0 - 5 WBC/hpf   Bacteria, UA RARE (A) NONE SEEN   Squamous Epithelial / LPF 0-5 0 - 5    Comment: Performed at  [facility], [address] [place], [place] [address]  Wet prep, genital     Status: Abnormal   Collection Time: 05/25/21  8:54 PM   Specimen: PATH Cytology Cervicovaginal Ancillary Only  Result Value Ref Range   Yeast Wet Prep HPF POC NONE SEEN NONE SEEN   Trich, Wet Prep NONE SEEN NONE SEEN   Clue Cells Wet Prep HPF POC NONE SEEN NONE SEEN   WBC, Wet Prep HPF POC MANY (A) NONE SEEN   Sperm NONE SEEN     Comment: Performed at [facility], [address], [place], [place] [address]    IMAGING US OB LESS THAN 14 WEEKS WITH [place] TRANSVAGINAL  Result Date: 05/15/2021 CLINICAL DATA:  Abdominal pain for 1 week. Clinical gestational age of [redacted] weeks and 2 days. EXAM: OBSTETRIC <14 WK [place] AND TRANSVAGINAL OB US TECHNIQUE: Both transabdominal and transvaginal ultrasound examinations were performed for complete evaluation of the gestation as well as the maternal uterus, adnexal regions, and pelvic cul-de-sac. Transvaginal technique was performed to assess early pregnancy. COMPARISON:  None. FINDINGS: Intrauterine gestational sac: Single Yolk sac:  Visualized Embryo:  Visualized Cardiac Activity: Visualized Heart Rate: 115 bpm CRL:  2.4 mm   5 w   5 d                  [place] EDC: 01/10/2022 Subchorionic hemorrhage:  None visualized. Maternal uterus/adnexae: Right ovary: Normal containing corpus luteum Left ovary: Normal Other :None Free fluid:  Trace IMPRESSION: 1. Single living intrauterine gestation with an estimated gestational age of [redacted] weeks and 5 days. Note: The gestational age by ultrasound is discordant with the clinical gestational age of [redacted] weeks and 2 days. Electronically Signed   By: Signa Kell M.D.   On: 05/15/2021 11:47    MAU COURSE Bedside ultrasound done: showed viable IUP with FHR 150s. Patient reassured. Wet prep negative, GC/Chlam pending Known to be A positive from outside records  ASSESSMENT 1. Vaginal bleeding in pregnancy, first trimester   2. Viable pregnancy in  first trimester   3. [redacted] weeks gestation of pregnancy     PLAN Patient reassured by ultrasound findings Bleeding precautions reviewed Will follow up GC/Chlam results and manage accordingly Warning signs for worsening condition that would warrant emergency follow-up discussed Patient may return to MAU as needed for pregnancy related complaints Discharged to home in stable condition  Allergies as of 05/25/2021       Reactions   Cough Syrup [guaifenesin]    Tachycardia, actual medication was Tox-Sol        Medication List  TAKE these medications    promethazine 12.5 MG tablet Commonly known as: PHENERGAN Take 1 tablet (12.5 mg total) by mouth every 6 (six) hours as needed for nausea or vomiting.        Evaluation does not show pathology that would require ongoing emergent intervention or inpatient treatment. Patient is hemodynamically stable and mentating appropriately. Discussed findings and plan with patient, who agrees with care plan. All questions answered. Return precautions discussed and outpatient follow up recommendations given.  Tereso Newcomer, MD 05/25/2021 10:47 PM

## 2021-05-25 NOTE — MAU Note (Signed)
Presents with c/o bright red VB and abdominal cramping  that began today.  Also states has felt dizzy all day today.

## 2021-05-26 ENCOUNTER — Encounter: Payer: Self-pay | Admitting: Obstetrics & Gynecology

## 2021-05-26 LAB — GC/CHLAMYDIA PROBE AMP (~~LOC~~) NOT AT ARMC
Chlamydia: NEGATIVE
Comment: NEGATIVE
Comment: NORMAL
Neisseria Gonorrhea: NEGATIVE

## 2021-07-03 NOTE — Progress Notes (Signed)
Patient did not show for appointment.   

## 2021-07-07 ENCOUNTER — Encounter: Payer: Self-pay | Admitting: Family

## 2021-07-09 ENCOUNTER — Encounter (HOSPITAL_COMMUNITY): Payer: Self-pay | Admitting: Obstetrics & Gynecology

## 2021-07-09 ENCOUNTER — Other Ambulatory Visit: Payer: Self-pay

## 2021-07-09 ENCOUNTER — Inpatient Hospital Stay (HOSPITAL_COMMUNITY)
Admission: AD | Admit: 2021-07-09 | Discharge: 2021-07-09 | Disposition: A | Discharge status: Home / Self Care | Payer: Self-pay | Attending: Obstetrics & Gynecology | Admitting: Obstetrics & Gynecology

## 2021-07-09 DIAGNOSIS — R42 Dizziness and giddiness: Secondary | ICD-10-CM | POA: Insufficient documentation

## 2021-07-09 DIAGNOSIS — F17210 Nicotine dependence, cigarettes, uncomplicated: Secondary | ICD-10-CM | POA: Insufficient documentation

## 2021-07-09 DIAGNOSIS — O99331 Smoking (tobacco) complicating pregnancy, first trimester: Secondary | ICD-10-CM | POA: Insufficient documentation

## 2021-07-09 DIAGNOSIS — Z3A13 13 weeks gestation of pregnancy: Secondary | ICD-10-CM | POA: Insufficient documentation

## 2021-07-09 DIAGNOSIS — O26891 Other specified pregnancy related conditions, first trimester: Secondary | ICD-10-CM | POA: Insufficient documentation

## 2021-07-09 DIAGNOSIS — O219 Vomiting of pregnancy, unspecified: Secondary | ICD-10-CM | POA: Insufficient documentation

## 2021-07-09 DIAGNOSIS — O99891 Other specified diseases and conditions complicating pregnancy: Secondary | ICD-10-CM | POA: Insufficient documentation

## 2021-07-09 DIAGNOSIS — R55 Syncope and collapse: Secondary | ICD-10-CM | POA: Insufficient documentation

## 2021-07-09 LAB — CBC
HCT: 41.9 % (ref 36.0–46.0)
Hemoglobin: 14.3 g/dL (ref 12.0–15.0)
MCH: 31 pg (ref 26.0–34.0)
MCHC: 34.1 g/dL (ref 30.0–36.0)
MCV: 90.9 fL (ref 80.0–100.0)
Platelets: 327 10*3/uL (ref 150–400)
RBC: 4.61 MIL/uL (ref 3.87–5.11)
RDW: 13.2 % (ref 11.5–15.5)
WBC: 12.3 10*3/uL — ABNORMAL HIGH (ref 4.0–10.5)
nRBC: 0 % (ref 0.0–0.2)

## 2021-07-09 LAB — URINALYSIS, ROUTINE W REFLEX MICROSCOPIC
Bilirubin Urine: NEGATIVE
Glucose, UA: NEGATIVE mg/dL
Hgb urine dipstick: NEGATIVE
Ketones, ur: NEGATIVE mg/dL
Leukocytes,Ua: NEGATIVE
Nitrite: NEGATIVE
Protein, ur: NEGATIVE mg/dL
Specific Gravity, Urine: 1.031 — ABNORMAL HIGH (ref 1.005–1.030)
pH: 5 (ref 5.0–8.0)

## 2021-07-09 MED ORDER — LACTATED RINGERS IV BOLUS
1000.0000 mL | Freq: Once | INTRAVENOUS | Status: AC
  Administered 2021-07-09: 1000 mL via INTRAVENOUS

## 2021-07-09 MED ORDER — ONDANSETRON 4 MG PO TBDP: 4.0000 mg | ORAL_TABLET | Freq: Three times a day (TID) | ORAL | 0 refills | Status: DC | PRN

## 2021-07-09 NOTE — MAU Provider Note (Signed)
History     [id number]  Arrival date and time: 07/09/21 1711    Chief Complaint  Patient presents with   Loss of Consciousness   Dizziness   low energy     HPI Stacey Tapia is a 25 y.o. at [redacted]w[redacted]d by 7wk [place], who presents for pre-syncopal event.   Patient previously seen in MAU in 05/2021 and had confirmed IUP at that time  Today reports she was in the shower and started to feel dizzy Seeing spots and felt like she was going to pass out No associated nausea or dizziness Though she does endorse daily nausea, especially in the morning, with frequent episodes of emesis After she got out of the shower she held on to a railing and felt like she lost consciousness for a moment but did not fall She felt dizzy again while in the waiting room but does not currently She has been having some mild cramping but no significant abdominal pain No vaginal bleeding She feels like she is probably eating and drinking enough despite her daily nausea and vomiting     OB History     Gravida  1   Para      Term      Preterm      AB      Living         SAB      IAB      Ectopic      Multiple      Live Births              Past Medical History:  Diagnosis Date   Anxiety     Past Surgical History:  Procedure Laterality Date   UMBILICAL HERNIA REPAIR      History reviewed. No pertinent family history.  Social History   Socioeconomic History   Marital status: Married    Spouse name: Not on file   Number of children: Not on file   Years of education: Not on file   Highest education level: Not on file  Occupational History   Not on file  Tobacco Use   Smoking status: Some Days    Packs/day: 0.25    Types: Cigarettes   Smokeless tobacco: Never  Substance and Sexual Activity   Alcohol use: Never   Drug use: Never   Sexual activity: Not on file  Other Topics Concern   Not on file  Social History Narrative   Not on file   Social Determinants of  Health   Financial Resource Strain: Not on file  Food Insecurity: Not on file  Transportation Needs: Not on file  Physical Activity: Not on file  Stress: Not on file  Social Connections: Not on file  Intimate Partner Violence: Not on file    Allergies  Allergen Reactions   Cough Syrup [Guaifenesin]     Tachycardia, actual medication was Tox-Sol    No current facility-administered medications on file prior to encounter.   Current Outpatient Medications on File Prior to Encounter  Medication Sig Dispense Refill   promethazine (PHENERGAN) 12.5 MG tablet Take 1 tablet (12.5 mg total) by mouth every 6 (six) hours as needed for nausea or vomiting. 30 tablet 0     ROS Pertinent positives and negative per HPI, all others reviewed and negative  Physical Exam   BP (!) 112/55   Pulse 74   Temp 98.4 F (36.9 C) (Oral)   Resp 18   Ht 5\' 4"  (1.626 m)  Wt 57.5 kg   LMP 03/04/2021 (Approximate)   SpO2 100%   BMI 21.75 kg/m   Patient Vitals for the past 24 hrs:  BP Temp Temp src Pulse Resp SpO2 Height Weight  07/09/21 1842 (!) 112/55 -- -- 74 -- -- -- --  07/09/21 1743 112/67 98.4 F (36.9 C) Oral 81 18 100 % 5\' 4"  (1.626 m) 57.5 kg    Physical Exam Vitals reviewed.  Constitutional:      General: She is not in acute distress.    Appearance: She is well-developed. She is not diaphoretic.  Eyes:     General: No scleral icterus. Cardiovascular:     Rate and Rhythm: Normal rate and regular rhythm.     Heart sounds: Normal heart sounds. No murmur heard.   No friction rub. No gallop.  Pulmonary:     Effort: Pulmonary effort is normal. No respiratory distress.     Breath sounds: Normal breath sounds. No wheezing or rales.  Abdominal:     General: There is no distension.     Palpations: Abdomen is soft.     Tenderness: There is no abdominal tenderness. There is no guarding or rebound.  Skin:    General: Skin is warm and dry.  Neurological:     Mental Status: She is  alert.     Coordination: Coordination normal.     Cervical Exam    Bedside Ultrasound Not done  My interpretation: n/a  FHT 156 bpm by doppler  Labs Results for orders placed or performed during the hospital encounter of 07/09/21 (from the past 24 hour(s))  Urinalysis, Routine w reflex microscopic Urine, Clean Catch     Status: Abnormal   Collection Time: 07/09/21  6:27 PM  Result Value Ref Range   Color, Urine YELLOW YELLOW   APPearance HAZY (A) CLEAR   Specific Gravity, Urine 1.031 (H) 1.005 - 1.030   pH 5.0 5.0 - 8.0   Glucose, UA NEGATIVE NEGATIVE mg/dL   Hgb urine dipstick NEGATIVE NEGATIVE   Bilirubin Urine NEGATIVE NEGATIVE   Ketones, ur NEGATIVE NEGATIVE mg/dL   Protein, ur NEGATIVE NEGATIVE mg/dL   Nitrite NEGATIVE NEGATIVE   Leukocytes,Ua NEGATIVE NEGATIVE  CBC     Status: Abnormal   Collection Time: 07/09/21  7:54 PM  Result Value Ref Range   WBC 12.3 (H) 4.0 - 10.5 K/uL   RBC 4.61 3.87 - 5.11 MIL/uL   Hemoglobin 14.3 12.0 - 15.0 g/dL   HCT 09/08/21 48.0 - 16.5 %   MCV 90.9 80.0 - 100.0 fL   MCH 31.0 26.0 - 34.0 pg   MCHC 34.1 30.0 - 36.0 g/dL   RDW 53.7 48.2 - 70.7 %   Platelets 327 150 - 400 K/uL   nRBC 0.0 0.0 - 0.2 %    Imaging No results found.  MAU Course  Procedures Lab Orders         Urinalysis, Routine w reflex microscopic Urine, Clean Catch         CBC    Meds ordered this encounter  Medications   lactated ringers bolus 1,000 mL   ondansetron (ZOFRAN ODT) 4 MG disintegrating tablet    Sig: Take 1 tablet (4 mg total) by mouth every 8 (eight) hours as needed for nausea or vomiting.    Dispense:  20 tablet    Refill:  0   Imaging Orders  No imaging studies ordered today    MDM moderate  Assessment and Plan  #Pre-syncope Patient presenting  after an episode of syncope. Likely secondary to hypovolemia given positive orthostatics by heart rate and concentrated SG on UA. ECG unremarkable and cardiac physical exam also unremarkable, low  suspicion for cardiac etiology given prodromal symptoms. CBC unremarkable as well.  #FWB Normal FHR by doppler  Dispo: Discharged to home in stable condition.   Venora Maples, MD/MPH 07/09/21 8:14 PM  Allergies as of 07/09/2021       Reactions   Cough Syrup [guaifenesin]    Tachycardia, actual medication was Tox-Sol        Medication List     TAKE these medications    ondansetron 4 MG disintegrating tablet Commonly known as: Zofran ODT Take 1 tablet (4 mg total) by mouth every 8 (eight) hours as needed for nausea or vomiting.   promethazine 12.5 MG tablet Commonly known as: PHENERGAN Take 1 tablet (12.5 mg total) by mouth every 6 (six) hours as needed for nausea or vomiting.

## 2021-07-09 NOTE — MAU Note (Signed)
When she was taking a shower, she started seeing stars, she stepped out of the shower, was so dizzy, "woke up, didn't know what was going on". Had pain in her outer rt leg initially, no pain any where else, no pain now. Has no energy. No bleeding.

## 2021-07-20 ENCOUNTER — Other Ambulatory Visit: Payer: Self-pay

## 2021-07-20 ENCOUNTER — Ambulatory Visit (INDEPENDENT_AMBULATORY_CARE_PROVIDER_SITE_OTHER): Payer: Self-pay | Admitting: Obstetrics

## 2021-07-20 ENCOUNTER — Encounter: Payer: Self-pay | Admitting: Obstetrics

## 2021-07-20 VITALS — BP 107/66 | HR 89 | Wt 130.4 lb

## 2021-07-20 DIAGNOSIS — N6323 Unspecified lump in the left breast, lower outer quadrant: Secondary | ICD-10-CM

## 2021-07-20 DIAGNOSIS — N6320 Unspecified lump in the left breast, unspecified quadrant: Secondary | ICD-10-CM

## 2021-07-20 DIAGNOSIS — O210 Mild hyperemesis gravidarum: Secondary | ICD-10-CM

## 2021-07-20 DIAGNOSIS — Z3400 Encounter for supervision of normal first pregnancy, unspecified trimester: Secondary | ICD-10-CM | POA: Insufficient documentation

## 2021-07-20 NOTE — Progress Notes (Signed)
NOB in office, [facility], transferred from [facility] after 1 appt, pt is unsure of what was done at previous office. Pt states that she may feel fetal flutter movements, no pain today. Pt is married, unplanned pregnancy.

## 2021-07-20 NOTE — Progress Notes (Signed)
Subjective:    Stacey Tapia is being seen today for her first obstetrical visit.  This is not a planned pregnancy. She is at [redacted]w[redacted]d gestation. Her obstetrical history is significant for . Relationship with FOB: spouse, living together. Patient does intend to breast feed. Pregnancy history fully reviewed.  The information documented in the HPI was reviewed and verified.  Menstrual History: OB History     Gravida  1   Para      Term      Preterm      AB      Living  0      SAB      IAB      Ectopic      Multiple      Live Births               Patient's last menstrual period was 03/04/2021 (approximate).    Past Medical History:  Diagnosis Date   Anxiety     Past Surgical History:  Procedure Laterality Date   UMBILICAL HERNIA REPAIR      (Not in a hospital admission)  Allergies  Allergen Reactions   Cough Syrup [Guaifenesin]     Tachycardia, actual medication was Tox-Sol    Social History   Tobacco Use   Smoking status: Former    Packs/day: 0.25    Types: Cigarettes    Quit date: 05/03/2021    Years since quitting: 0.2   Smokeless tobacco: Never  Substance Use Topics   Alcohol use: Never    Family History  Problem Relation Age of Onset   Stomach cancer Maternal Grandmother    Cancer Paternal Grandmother      Review of Systems Constitutional: negative for weight loss Gastrointestinal: negative for vomiting Genitourinary:negative for genital lesions and vaginal discharge and dysuria Musculoskeletal:negative for back pain Behavioral/Psych: negative for abusive relationship, depression, illegal drug usage and tobacco use    Objective:    BP 107/66   Pulse 89   Wt 130 lb 6.4 oz (59.1 kg)   LMP 03/04/2021 (Approximate)   BMI 22.38 kg/m  General Appearance:    Alert, cooperative, no distress, appears stated age  Head:    Normocephalic, without obvious abnormality, atraumatic  Eyes:    PERRL, conjunctiva/corneas clear, EOM's  intact, fundi    benign, both eyes  Ears:    Normal TM's and external ear canals, both ears  Nose:   Nares normal, septum midline, mucosa normal, no drainage    or sinus tenderness  Throat:   Lips, mucosa, and tongue normal; teeth and gums normal  Neck:   Supple, symmetrical, trachea midline, no adenopathy;    thyroid:  no enlargement/tenderness/nodules; no carotid   bruit or JVD  Back:     Symmetric, no curvature, ROM normal, no CVA tenderness  Lungs:     Clear to auscultation bilaterally, respirations unlabored  Chest Wall:    No tenderness or deformity   Heart:    Regular rate and rhythm, S1 and S2 normal, no murmur, rub   or gallop  Breast Exam:    No tenderness, masses, or nipple abnormality  Abdomen:     Soft, non-tender, bowel sounds active all four quadrants,    no masses, no organomegaly  Genitalia:    Normal female without lesion, discharge or tenderness  Extremities:   Extremities normal, atraumatic, no cyanosis or edema  Pulses:   2+ and symmetric all extremities  Skin:   Skin color, texture, turgor normal,  no rashes or lesions  Lymph nodes:   Cervical, supraclavicular, and axillary nodes normal  Neurologic:   CNII-XII intact, normal strength, sensation and reflexes    throughout      Lab Review Urine pregnancy test Labs reviewed yes Radiologic studies reviewed yes  Assessment:    Pregnancy at [redacted]w[redacted]d weeks    Plan:    1. Supervision of normal first pregnancy, antepartum  2. Morning sickness, mild - dietary changes recommended.  No meds needed  3. Breast lump on left side at 5 o'clock position Rx: - [facility] Digital Diagnostic Unilat L; Future    Prenatal vitamins.  Counseling provided regarding continued use of seat belts, cessation of alcohol consumption, smoking or use of illicit drugs; infection precautions i.e., influenza/TDAP immunizations, toxoplasmosis,CMV, parvovirus, listeria and varicella; workplace safety, exercise during pregnancy; routine dental care,  safe medications, sexual activity, hot tubs, saunas, pools, travel, caffeine use, fish and methlymercury, potential toxins, hair treatments, varicose veins Weight gain recommendations per IOM guidelines reviewed: underweight/BMI< 18.5--> gain 28 - 40 lbs; normal weight/BMI 18.5 - 24.9--> gain 25 - 35 lbs; overweight/BMI 25 - 29.9--> gain 15 - 25 lbs; obese/BMI >30->gain  11 - 20 lbs Problem list reviewed and updated. FIRST/CF mutation testing/NIPT/QUAD SCREEN/fragile X/Ashkenazi Jewish population testing/Spinal muscular atrophy discussed: requested. Role of ultrasound in pregnancy discussed; fetal survey: requested. Amniocentesis discussed: not indicated.    Orders Placed This Encounter  Procedures   MM Digital Diagnostic Unilat L    Standing Status:   Future    Standing Expiration Date:   07/20/2022    Order Specific Question:   Reason for Exam (SYMPTOM  OR DIAGNOSIS REQUIRED)    Answer:   Breast lump, left breast at 5 o'clock, soft, mobile    Order Specific Question:   Is the patient pregnant?    Answer:   Yes    Order Specific Question:   Preferred imaging location?    [facility]   [facility]    Follow up in 4 weeks.  I have spent a total of 20 minutes of face-to-face time, excluding clinical staff time, reviewing notes and preparing to see patient, ordering tests and/or medications, and counseling the patient.   Brock Bad, MD 07/20/2021 12:03 PM

## 2021-07-21 ENCOUNTER — Ambulatory Visit: Payer: Self-pay | Admitting: *Deleted

## 2021-07-21 VITALS — BP 106/70 | Wt 128.8 lb

## 2021-07-21 DIAGNOSIS — N6323 Unspecified lump in the left breast, lower outer quadrant: Secondary | ICD-10-CM

## 2021-07-21 DIAGNOSIS — Z1239 Encounter for other screening for malignant neoplasm of breast: Secondary | ICD-10-CM

## 2021-07-21 NOTE — Progress Notes (Signed)
[name] is a 25 y.o. female who presents to [facility] clinic today with complaint of left breast lump x 2 years that has increased in size since she became pregnant. Patient is currently [redacted] weeks pregnant. Patient stated the lump is painful. Patient rates the pain at a 7 out of 10.    Pap Smear: Pap smear not completed today. Last Pap smear was in July 2022 at [facility] per patient and patient stated she has not received the result. Per patient has no history of an abnormal Pap smear. Last Pap smear result is not available in Epic.   Physical exam: Breasts Left breast larger than right breast that per patient is normal for her. No skin abnormalities bilateral breasts. No nipple retraction bilateral breasts. No nipple discharge bilateral breasts. No lymphadenopathy. No lumps palpated right breast. Palpated a lump within the left breast at 5 o'clock 7 cm from the nipple. Complaints of tenderness when palpated left breast lump.   Pelvic/Bimanual Pap is not indicated today per BCCCP guidelines.   Smoking History: Patient is a former smoker that quit 05/03/2021.   Patient Navigation: Patient education provided. Access to services provided for patient through B[facility]CP program.    Breast and Cervical Cancer Risk Assessment: Patient does not have family history of breast cancer, known genetic mutations, or radiation treatment to the chest before age 51. Patient does not have history of cervical dysplasia, immunocompromised, or DES exposure in-utero. Breast cancer risk assessment completed. No breast cancer risk calculated due to patient is less than 32 years old.  Risk Assessment     Risk Scores       07/21/2021   Last edited by: Narda Rutherford, LPN   5-year risk:    Lifetime risk:             A: B[facility]CP exam without pap smear Complaint of left breast lump and pain.  P: Referred patient to the [facility] for a left breast ultrasound.  Appointment scheduled Thursday, July 30, 2021 at 1010.  Priscille Heidelberg, RN 07/21/2021 1:58 PM

## 2021-07-21 NOTE — Patient Instructions (Signed)
Explained breast self awareness with [name]. Patient did not need a Pap smear today due to last Pap smear was in July 2022 per patient. Let her know B[facility]CP will cover Pap smears every 3 years unless has a history of abnormal Pap smears. Referred patient to the [facility] for a left breast ultrasound. Appointment scheduled Thursday, July 30, 2021 at 1010. Patient aware of appointment and will be there. Stacey Tapia verbalized understanding.  [name], [name], RN 1:58 PM

## 2021-07-30 ENCOUNTER — Other Ambulatory Visit: Payer: Self-pay

## 2021-07-30 ENCOUNTER — Other Ambulatory Visit: Payer: Self-pay | Admitting: Obstetrics and Gynecology

## 2021-07-30 ENCOUNTER — Ambulatory Visit
Admission: RE | Admit: 2021-07-30 | Discharge: 2021-07-30 | Disposition: A | Discharge status: Home / Self Care | Payer: No Typology Code available for payment source | Source: Ambulatory Visit | Attending: Obstetrics and Gynecology | Admitting: Obstetrics and Gynecology

## 2021-07-30 DIAGNOSIS — N6320 Unspecified lump in the left breast, unspecified quadrant: Secondary | ICD-10-CM

## 2021-08-17 ENCOUNTER — Ambulatory Visit (INDEPENDENT_AMBULATORY_CARE_PROVIDER_SITE_OTHER): Payer: Self-pay | Admitting: Obstetrics and Gynecology

## 2021-08-17 ENCOUNTER — Encounter: Payer: Self-pay | Admitting: Obstetrics and Gynecology

## 2021-08-17 ENCOUNTER — Other Ambulatory Visit: Payer: Self-pay

## 2021-08-17 VITALS — BP 101/66 | HR 78 | Wt 142.0 lb

## 2021-08-17 DIAGNOSIS — Z3400 Encounter for supervision of normal first pregnancy, unspecified trimester: Secondary | ICD-10-CM

## 2021-08-17 NOTE — Progress Notes (Signed)
Subjective:  Zoe Goonan is a 25 y.o. G1P0 at [redacted]w[redacted]d being seen today for ongoing prenatal care.  She is currently monitored for the following issues for this low-risk pregnancy and has Supervision of normal first pregnancy, antepartum on their problem list.  Patient reports general discomforts of pregnancy.  Contractions: Not present. Vag. Bleeding: None.   . Denies leaking of fluid.   The following portions of the patient's history were reviewed and updated as appropriate: allergies, current medications, past family history, past medical history, past social history, past surgical history and problem list. Problem list updated.  Objective:   Vitals:   08/17/21 0847  BP: 101/66  Pulse: 78  Weight: 142 lb (64.4 kg)    Fetal Status:           General:  Alert, oriented and cooperative. Patient is in no acute distress.  Skin: Skin is warm and dry. No rash noted.   Cardiovascular: Normal heart rate noted  Respiratory: Normal respiratory effort, no problems with respiration noted  Abdomen: Soft, gravid, appropriate for gestational age. Pain/Pressure: Absent     Pelvic:  Cervical exam deferred        Extremities: Normal range of motion.  Edema: None  Mental Status: Normal mood and affect. Normal behavior. Normal judgment and thought content.   Urinalysis:      Assessment and Plan:  Pregnancy: G1P0 at [redacted]w[redacted]d  1. Supervision of normal first pregnancy, antepartum Stable AFP today Anatomy scan ordered Front office following up on release of medical records from [facility]   Preterm labor symptoms and general obstetric precautions including but not limited to vaginal bleeding, contractions, leaking of fluid and fetal movement were reviewed in detail with the patient. Please refer to After Visit Summary for other counseling recommendations.  Return in about 4 weeks (around 09/14/2021) for OB visit, face to face, any provider.   Hermina Staggers, MD

## 2021-08-17 NOTE — Progress Notes (Signed)
+   Fetal movement. Pt c/o increased fatigue and HA. Pt states she has had a HA for 4 days. States she is drinking 2-3 bottles of water a day. She is eating 2 larger meals at lunch and dinner. Does not normally eat breakfast.

## 2021-08-17 NOTE — Patient Instructions (Signed)
Segundo trimestre de embarazo °Second Trimester of Pregnancy °El segundo trimestre de embarazo va desde la semana 13 hasta la semana 27. También se dice que va desde el mes 4 hasta el mes 6 de embarazo. Este suele ser el momento en el que mejor se siente. °Durante el segundo trimestre: °Las náuseas del embarazo han disminuido o han desaparecido. °Usted puede tener más energía. °Usted puede tener hambre con más frecuencia. °En esta época, el bebé en gestación (feto) crece muy rápido. Hacia el final del sexto mes, el bebé en gestación puede medir aproximadamente 12 pulgadas y pesar alrededor de 1½ libras. Es probable que comience a sentir que el bebé se mueve entre las 16 y las 20 semanas de embarazo. °Cambios en el cuerpo durante el segundo trimestre °Su organismo continúa atravesando por muchos cambios durante este período. Los cambios varían y generalmente vuelven a la normalidad después del nacimiento del bebé. °Cambios físicos °Aumentará más peso. °Podrán aparecer las primeras estrías en las caderas, el vientre (abdomen) y las mamas. °Las mamas crecerán y pueden doler. °Pueden aparecer zonas oscuras o manchas en el rostro. °Es posible que se forme una línea oscura desde el ombligo hasta la zona del pubis (linea nigra). °Tal vez haya cambios en el cabello. °Cambios en la salud °Es posible que tenga dolores de cabeza. °Es posible que tenga acidez estomacal. °Es posible que tenga dificultades para defecar (estreñimiento). °Es posible que tenga hemorroides o venas abultadas e hinchadas (venas varicosas). °Las encías pueden sangrarle. °Es posible que haga pis (orine) con mayor frecuencia. °Puede sentir dolor en la espalda. °Siga estas instrucciones en su casa: °Medicamentos °Use los medicamentos de venta libre y los recetados solamente como se lo haya indicado el médico. Algunos medicamentos no son seguros durante el embarazo. °Tome vitaminas prenatales que contengan por lo menos 600 microgramos (mcg) de ácido  fólico. °Comida y bebida °Consuma comidas saludables que incluyan lo siguiente: °Frutas y verduras frescas. °Cereales integrales. °Buenas fuentes de proteínas, como carne, huevos y tofu. °Productos lácteos con bajo contenido de grasa. °Evite la carne cruda y el jugo, la leche y el queso sin pasteurizar. °Es posible que deba tomar medidas para prevenir o tratar los problemas para defecar: °Beber suficiente líquido para mantener el pis (orina) de color amarillo pálido. °Come alimentos ricos en fibra. Entre ellos, frijoles, cereales integrales y frutas y verduras frescas. °Limitar los alimentos con alto contenido de grasa y azúcar. Estos incluyen alimentos fritos o dulces. °Actividad °Haga ejercicios solamente como se lo haya indicado el médico. La mayoría de las personas pueden realizar su actividad física habitual durante el embarazo. Intente realizar como mínimo 30 minutos de actividad física por lo menos 5 días a la semana. °Deje de hacer ejercicio si tiene dolor o cólicos en el vientre o en la zona lumbar. °No haga ejercicio si hace demasiado calor, hay demasiada humedad o se encuentra en un lugar de mucha altura (altitud elevada). °Evite levantar pesos excesivos. °Si lo desea, puede continuar teniendo relaciones sexuales, a menos que el médico le indique lo contrario. °Alivio del dolor y del malestar °Use un sostén que le brinde buen soporte si le duelen las mamas. °Dese baños de asiento con agua tibia para aliviar el dolor o las molestias causadas por las hemorroides. Use una crema para las hemorroides si el médico la autoriza. °Descanse con las piernas levantadas (elevadas) si tiene calambres en las piernas o dolor en la parte baja de la espalda. °Si desarrolla venas abultadas en las piernas: °Use   medias de compresión según las indicaciones de su médico. °Levante los pies durante 15 minutos, 3 o 4 veces por día. °Limite la sal en sus alimentos. °Seguridad °Use el cinturón de seguridad en todo momento mientras  vaya en auto. °Hable con el médico si alguien le está haciendo daño o gritando mucho. °Estilo de vida °No se dé baños de inmersión en agua caliente, baños turcos ni saunas. °No se haga duchas vaginales. No use tampones ni toallas higiénicas perfumadas. °Evite el contacto con las bandejas sanitarias de los gatos y la tierra que estos animales usan. Estos contienen gérmenes que pueden dañar al bebé y causar la pérdida del bebé ya sea aborto espontáneo o muerte fetal. °No consuma medicamentos a base de hierbas, drogas ilegales, ni medicamentos que el médico no haya autorizado. No beba alcohol. °No fume ni consuma ningún producto que contenga nicotina o tabaco. Si necesita ayuda para dejar de fumar, consulte al médico. °Instrucciones generales °Cumpla con todas las visitas de seguimiento. Esto es importante. °Consulte a su médico acerca de dónde se dictan clases prenatales cerca de donde vive. °Consulte a su médico sobre los alimentos que debe comer o pídale que la ayude a encontrar a un asesor. °Dónde buscar más información °American Pregnancy Association (Asociación Americana del Embarazo): americanpregnancy.org °American College of Obstetricians and Gynecologists (Colegio Estadounidense de Obstetras y Ginecólogos): www.acog.org °Office on Women's Health (Oficina para la Salud de la Mujer): womenshealth.gov/pregnancy °Comuníquese con un médico si: °Tiene un dolor de cabeza que no desaparece después de tomar analgésicos. °Nota cambios en la visión o ve manchas delante de los ojos. °Tiene cólicos o siente presión o dolor leves en la parte baja del vientre. °Sigue sintiendo como si fuera a vomitar (náuseas), vomita o hace deposiciones acuosas (diarrea). °Advierte líquido con mal olor que proviene de la vagina. °Siente dolor al orinar o hace orina con mal olor. °Tiene una gran hinchazón en la cara, las manos, las piernas, los tobillos o los pies. °Tiene fiebre. °Solicite ayuda de inmediato si: °Tiene una pérdida de  líquido por la vagina. °Tiene sangrado o pequeñas pérdidas vaginales. °Tiene cólicos o dolor muy intensos en el vientre. °Tiene dificultad para respirar. °Sientes dolor en el pecho. °Se desmaya. °No ha sentido que el bebé se moviera durante el período de tiempo que le dijo el médico. °Tiene dolor, hinchazón o enrojecimiento nuevos en un brazo o una pierna o se produce un aumento de alguno de estos síntomas. °Resumen °El segundo trimestre de embarazo va desde la semana 13 hasta la 27 (desde el mes 4 hasta el 6). °Consuma comidas saludables. °Haga ejercicios tal como le indicó el médico. La mayoría de las personas pueden realizar su actividad física habitual durante el embarazo. °No consuma medicamentos a base de hierbas, drogas ilegales, ni medicamentos que el médico no haya autorizado. No beba alcohol. °Llame al médico si se enferma o si nota algo inusual acerca de su embarazo. °Esta información no tiene como fin reemplazar el consejo del médico. Asegúrese de hacerle al médico cualquier pregunta que tenga. °Document Revised: 05/09/2020 Document Reviewed: 05/09/2020 °Elsevier Patient Education © 2022 Elsevier Inc. ° °

## 2021-08-19 LAB — AFP, SERUM, OPEN SPINA BIFIDA
AFP MoM: 1.46
AFP Value: 79.9 ng/mL
Gest. Age on Collection Date: 19.1 weeks
Maternal Age At EDD: 24.9 yr
OSBR Risk 1 IN: 3048
Test Results:: NEGATIVE
Weight: 142 [lb_av]

## 2021-08-24 ENCOUNTER — Telehealth: Payer: Self-pay

## 2021-08-24 NOTE — Telephone Encounter (Signed)
Call patient- to inform her of AFP test results.

## 2021-08-24 NOTE — Telephone Encounter (Signed)
-----   Message from Hermina Staggers, MD sent at 08/19/2021 12:14 PM EDT ----- Please let pt know that her AFP was negative.  Thanks Casimiro Needle

## 2021-08-25 ENCOUNTER — Inpatient Hospital Stay (HOSPITAL_COMMUNITY)
Admission: AD | Admit: 2021-08-25 | Discharge: 2021-08-25 | Disposition: A | Discharge status: Home / Self Care | Payer: No Typology Code available for payment source | Attending: Obstetrics and Gynecology | Admitting: Obstetrics and Gynecology

## 2021-08-25 DIAGNOSIS — Z3A20 20 weeks gestation of pregnancy: Secondary | ICD-10-CM | POA: Insufficient documentation

## 2021-08-25 DIAGNOSIS — O368120 Decreased fetal movements, second trimester, not applicable or unspecified: Secondary | ICD-10-CM | POA: Insufficient documentation

## 2021-08-25 NOTE — MAU Provider Note (Signed)
Patient Stacey Tapia is a 25 y.o.  G1P0 at [redacted]w[redacted]d here after she called her nurse with the [facility] to report that she wasn't feeling any movements. She reports that she feels the baby moving every day but today she did not. Her FNP nurse told her to come to MAU. She denies LOF, vaginal bleeding, contractions, abnormal discharge, dysuria, no concerns for STI.  She says that she slept well, did not have any nausea or fatigue so she thought maybe she was having a miscarriage. She appears distressed.  Fetal heart rate in triage was 150 bpm.   Patient Vitals for the past 24 hrs:  BP Temp Temp src Pulse Resp SpO2 Weight  08/25/21 1302 111/65 99.4 F (37.4 C) Oral 90 18 100 % 65 kg   -reassured patient about fetal movements, discussed warning signs and when to return to MAU. Discussed that it is hard to predict fetal movements in pregnancy this early on but that if she is ever concerned about baby she can come to [facility] for evaluation -keep appt on 09/14/2021 at [facility].  -Patient and partner are releived at [facility] and have no further questions; stable for discharge at this time.   Luna Kitchens

## 2021-08-25 NOTE — MAU Note (Signed)
3 wks ago started feeling movement every day.  Today when she woke up, she has not felt any movement. Called the nurse line, followed instructions and still not  feeling movement.  Today is the first day she has not thrown up. Not feeling any of the pregnancy things she has been feeling.

## 2021-09-14 ENCOUNTER — Encounter: Payer: Self-pay | Admitting: Advanced Practice Midwife

## 2021-09-14 ENCOUNTER — Other Ambulatory Visit: Payer: Self-pay

## 2021-09-14 ENCOUNTER — Ambulatory Visit (INDEPENDENT_AMBULATORY_CARE_PROVIDER_SITE_OTHER): Payer: No Typology Code available for payment source | Admitting: Advanced Practice Midwife

## 2021-09-14 VITALS — BP 103/65 | HR 79 | Wt 153.7 lb

## 2021-09-14 DIAGNOSIS — Z3400 Encounter for supervision of normal first pregnancy, unspecified trimester: Secondary | ICD-10-CM

## 2021-09-14 DIAGNOSIS — Z3A23 23 weeks gestation of pregnancy: Secondary | ICD-10-CM

## 2021-09-14 NOTE — Progress Notes (Signed)
   PRENATAL VISIT NOTE  Subjective:  Stacey Tapia is a 25 y.o. G1P0 at [redacted]w[redacted]d being seen today for ongoing prenatal care.  She is currently monitored for the following issues for this low-risk pregnancy and has Supervision of normal first pregnancy, antepartum on their problem list.  Patient reports no complaints.  Contractions: Not present. Vag. Bleeding: None.  Movement: Present. Denies leaking of fluid.   The following portions of the patient's history were reviewed and updated as appropriate: allergies, current medications, past family history, past medical history, past social history, past surgical history and problem list.   Objective:   Vitals:   09/14/21 0906  BP: 103/65  Pulse: 79  Weight: 153 lb 11.2 oz (69.7 kg)    Fetal Status:     Movement: Present     General:  Alert, oriented and cooperative. Patient is in no acute distress.  Skin: Skin is warm and dry. No rash noted.   Cardiovascular: Normal heart rate noted  Respiratory: Normal respiratory effort, no problems with respiration noted  Abdomen: Soft, gravid, appropriate for gestational age.  Pain/Pressure: Absent     Pelvic: Cervical exam deferred        Extremities: Normal range of motion.  Edema: None  Mental Status: Normal mood and affect. Normal behavior. Normal judgment and thought content.   Assessment and Plan:  Pregnancy: G1P0 at [redacted]w[redacted]d 1. Supervision of normal first pregnancy, antepartum --Anticipatory guidance about next visits/weeks of pregnancy given. --Reviewed anatomy US and previous lab results at pt request --next visit in 4 weeks  2. [redacted] weeks gestation of pregnancy   Preterm labor symptoms and general obstetric precautions including but not limited to vaginal bleeding, contractions, leaking of fluid and fetal movement were reviewed in detail with the patient. Please refer to After Visit Summary for other counseling recommendations.   No follow-ups on file.  Future Appointments  Date  Time Provider Department Center  10/12/2021  8:55 AM [place], Wilmer Floor, CNM [facility] None  01/28/2022 12:30 PM [facility] [place] [facility]    Sharen Counter, CNM

## 2021-10-12 ENCOUNTER — Ambulatory Visit (INDEPENDENT_AMBULATORY_CARE_PROVIDER_SITE_OTHER): Payer: No Typology Code available for payment source | Admitting: Advanced Practice Midwife

## 2021-10-12 ENCOUNTER — Other Ambulatory Visit: Payer: Self-pay

## 2021-10-12 VITALS — BP 98/65 | HR 87 | Wt 160.0 lb

## 2021-10-12 DIAGNOSIS — Z3400 Encounter for supervision of normal first pregnancy, unspecified trimester: Secondary | ICD-10-CM

## 2021-10-12 DIAGNOSIS — O99891 Other specified diseases and conditions complicating pregnancy: Secondary | ICD-10-CM

## 2021-10-12 DIAGNOSIS — M549 Dorsalgia, unspecified: Secondary | ICD-10-CM

## 2021-10-12 DIAGNOSIS — Z3A27 27 weeks gestation of pregnancy: Secondary | ICD-10-CM

## 2021-10-12 NOTE — Progress Notes (Signed)
Pt states she was sick last week, looked like she may have been vomiting blood/ dark brown emesis.  Pt states she is feeling better now, eating and drinking normally.

## 2021-10-12 NOTE — Progress Notes (Signed)
   PRENATAL VISIT NOTE  Subjective:  Stacey Tapia is a 25 y.o. G1P0 at [redacted]w[redacted]d being seen today for ongoing prenatal care.  She is currently monitored for the following issues for this low-risk pregnancy and has Supervision of normal first pregnancy, antepartum on their problem list.  Patient reports  occasional back pain .  Contractions: Not present. Vag. Bleeding: None.  Movement: Present. Denies leaking of fluid.   The following portions of the patient's history were reviewed and updated as appropriate: allergies, current medications, past family history, past medical history, past social history, past surgical history and problem list.   Objective:   Vitals:   10/12/21 0903  BP: 98/65  Pulse: 87  Weight: 160 lb (72.6 kg)    Fetal Status: Fetal Heart Rate (bpm): 140   Movement: Present     General:  Alert, oriented and cooperative. Patient is in no acute distress.  Skin: Skin is warm and dry. No rash noted.   Cardiovascular: Normal heart rate noted  Respiratory: Normal respiratory effort, no problems with respiration noted  Abdomen: Soft, gravid, appropriate for gestational age.  Pain/Pressure: Absent     Pelvic: Cervical exam deferred        Extremities: Normal range of motion.     Mental Status: Normal mood and affect. Normal behavior. Normal judgment and thought content.   Assessment and Plan:  Pregnancy: G1P0 at [redacted]w[redacted]d 1. Supervision of normal first pregnancy, antepartum --Anticipatory guidance about next visits/weeks of pregnancy given. --Had recent GI virus vs food borne illness with vomiting.  Had light red/pink blood in her emesis. Called the office and talked with RN and vomiting improved, bleeding resolved.  Reassurance provided. Pt no longer vomiting and feels well today. --Next visit in 2 weeks  - Glucose Tolerance, 2 Hours w/1 Hour - RPR - CBC - HIV antibody (with reflex)  2. [redacted] weeks gestation of pregnancy   3. Back pain affecting pregnancy in third  trimester --Mild, intermittent.   --Rest/ice/heat/warm bath/increase PO fluids/Tylenol/pregnancy support belt   Preterm labor symptoms and general obstetric precautions including but not limited to vaginal bleeding, contractions, leaking of fluid and fetal movement were reviewed in detail with the patient. Please refer to After Visit Summary for other counseling recommendations.   No follow-ups on file.  Future Appointments  Date Time Provider Department Center  01/28/2022 12:30 PM [facility] [place] [facility]    Sharen Counter, [place]

## 2021-10-13 LAB — CBC
Hematocrit: 36.6 % (ref 34.0–46.6)
Hemoglobin: 12.2 g/dL (ref 11.1–15.9)
MCH: 29.4 pg (ref 26.6–33.0)
MCHC: 33.3 g/dL (ref 31.5–35.7)
MCV: 88 fL (ref 79–97)
Platelets: 341 10*3/uL (ref 150–450)
RBC: 4.15 x10E6/uL (ref 3.77–5.28)
RDW: 12.3 % (ref 11.7–15.4)
WBC: 10.4 10*3/uL (ref 3.4–10.8)

## 2021-10-13 LAB — HIV ANTIBODY (ROUTINE TESTING W REFLEX): HIV Screen 4th Generation wRfx: NONREACTIVE

## 2021-10-13 LAB — GLUCOSE TOLERANCE, 2 HOURS W/ 1HR
Glucose, 1 hour: 166 mg/dL (ref 70–179)
Glucose, 2 hour: 73 mg/dL (ref 70–152)
Glucose, Fasting: 72 mg/dL (ref 70–91)

## 2021-10-13 LAB — RPR: RPR Ser Ql: NONREACTIVE

## 2021-10-26 ENCOUNTER — Other Ambulatory Visit: Payer: Self-pay

## 2021-10-26 ENCOUNTER — Encounter: Payer: Self-pay | Admitting: Obstetrics and Gynecology

## 2021-10-26 ENCOUNTER — Ambulatory Visit (INDEPENDENT_AMBULATORY_CARE_PROVIDER_SITE_OTHER): Payer: No Typology Code available for payment source | Admitting: Obstetrics and Gynecology

## 2021-10-26 VITALS — BP 127/82 | HR 109 | Wt 164.7 lb

## 2021-10-26 DIAGNOSIS — Z3400 Encounter for supervision of normal first pregnancy, unspecified trimester: Secondary | ICD-10-CM

## 2021-10-26 NOTE — Patient Instructions (Signed)

## 2021-10-26 NOTE — Progress Notes (Signed)
Subjective:  Stacey Tapia is a 25 y.o. G1P0 at [redacted]w[redacted]d being seen today for ongoing prenatal care.  She is currently monitored for the following issues for this low-risk pregnancy and has Supervision of normal first pregnancy, antepartum on their problem list.  Patient reports general discomforts of pregnancy.  Contractions: Not present. Vag. Bleeding: None.  Movement: Present. Denies leaking of fluid.   The following portions of the patient's history were reviewed and updated as appropriate: allergies, current medications, past family history, past medical history, past social history, past surgical history and problem list. Problem list updated.  Objective:   Vitals:   10/26/21 1342  BP: 127/82  Pulse: (!) 109  Weight: 164 lb 11.2 oz (74.7 kg)    Fetal Status: Fetal Heart Rate (bpm): 161   Movement: Present     General:  Alert, oriented and cooperative. Patient is in no acute distress.  Skin: Skin is warm and dry. No rash noted.   Cardiovascular: Normal heart rate noted  Respiratory: Normal respiratory effort, no problems with respiration noted  Abdomen: Soft, gravid, appropriate for gestational age. Pain/Pressure: Present     Pelvic:  Cervical exam deferred        Extremities: Normal range of motion.  Edema: None  Mental Status: Normal mood and affect. Normal behavior. Normal judgment and thought content.   Urinalysis:      Assessment and Plan:  Pregnancy: G1P0 at [redacted]w[redacted]d  1. Supervision of normal first pregnancy, antepartum Stable  Preterm labor symptoms and general obstetric precautions including but not limited to vaginal bleeding, contractions, leaking of fluid and fetal movement were reviewed in detail with the patient. Please refer to After Visit Summary for other counseling recommendations.  Return in about 2 weeks (around 11/09/2021) for OB visit, face to face, any provider.   Hermina Staggers, MD

## 2021-11-08 NOTE — L&D Delivery Note (Signed)
Delivery Note Pt progressed to C/C/+2 w/c/o pressure.  After a very brief 2nd stage, at 7:12 PM a viable female was delivered via Vaginal, Spontaneous (Presentation: Right Occiput Anterior, loose nuchal cord, reduced).  APGAR: 9, 9; weight pending. After 1 minute, the cord was clamped and cut. 40 units of pitocin diluted in 1000cc LR was infused rapidly IV.  The placenta separated spontaneously and delivered via CCT and maternal pushing effort.  It was inspected and appears to be intact with a 3 VC.   Anesthesia: Epidural Episiotomy: None Lacerations: 2nd degree Suture Repair: 2.0 vicryl Est. Blood Loss (mL): 175  Mom to postpartum.  Baby to Couplet care / Skin to Skin.  Christin Fudge 12/28/2021, 7:53 PM

## 2021-11-09 ENCOUNTER — Inpatient Hospital Stay (HOSPITAL_COMMUNITY)
Admission: AD | Admit: 2021-11-09 | Discharge: 2021-11-09 | Disposition: A | Discharge status: Home / Self Care | Payer: No Typology Code available for payment source | Attending: Obstetrics & Gynecology | Admitting: Obstetrics & Gynecology

## 2021-11-09 ENCOUNTER — Encounter (HOSPITAL_COMMUNITY): Payer: Self-pay | Admitting: Obstetrics & Gynecology

## 2021-11-09 ENCOUNTER — Other Ambulatory Visit: Payer: Self-pay

## 2021-11-09 DIAGNOSIS — O26893 Other specified pregnancy related conditions, third trimester: Secondary | ICD-10-CM | POA: Insufficient documentation

## 2021-11-09 DIAGNOSIS — M549 Dorsalgia, unspecified: Secondary | ICD-10-CM

## 2021-11-09 DIAGNOSIS — O99891 Other specified diseases and conditions complicating pregnancy: Secondary | ICD-10-CM

## 2021-11-09 DIAGNOSIS — Z3689 Encounter for other specified antenatal screening: Secondary | ICD-10-CM

## 2021-11-09 DIAGNOSIS — Z3A31 31 weeks gestation of pregnancy: Secondary | ICD-10-CM

## 2021-11-09 LAB — URINALYSIS, ROUTINE W REFLEX MICROSCOPIC
Bacteria, UA: NONE SEEN
Bilirubin Urine: NEGATIVE
Glucose, UA: NEGATIVE mg/dL
Hgb urine dipstick: NEGATIVE
Ketones, ur: NEGATIVE mg/dL
Nitrite: NEGATIVE
Protein, ur: NEGATIVE mg/dL
Specific Gravity, Urine: 1.013 (ref 1.005–1.030)
pH: 6 (ref 5.0–8.0)

## 2021-11-09 MED ORDER — ACETAMINOPHEN 500 MG PO TABS
1000.0000 mg | ORAL_TABLET | Freq: Once | ORAL | Status: AC
  Administered 2021-11-09: 1000 mg via ORAL
  Filled 2021-11-09: qty 2

## 2021-11-09 NOTE — MAU Note (Signed)
Stacey Tapia is a 26 y.o. at [redacted]w[redacted]d here in MAU reporting: this morning started having lower back pain that wraps around to her abdomen. Pain is constant but sometimes the intensity changes. No bleeding. No LOF. +FM  Onset of complaint: today  Pain score: 7/10  Vitals:   11/09/21 1619  BP: 114/67  Pulse: 84  Resp: 16  Temp: 97.9 F (36.6 C)  SpO2: 98%     FHT:142  Lab orders placed from triage: UA

## 2021-11-09 NOTE — MAU Provider Note (Signed)
Chief Complaint:  Back Pain   Event Date/Time   First Provider Initiated Contact with Patient 11/09/21 1658     HPI: Stacey Tapia is a 26 y.o. G1P0 at [redacted]w[redacted]d who presents to maternity admissions reporting low back pain that began yesterday morning and has progressed to wrapping around her abdomen making it feel tight and achy. Describes it as constant and aggravated by movement. Has not taken anything for the pain. Denies dysuria, frequency or urgency. Denies vaginal bleeding, leaking of fluid, decreased fetal movement, fever, falls, or recent illness. No other physical complaints.  Pregnancy Course: Receives care at [facility], prenatal records reviewed.  Past Medical History:  Diagnosis Date   Anxiety    OB History  Gravida Para Term Preterm AB Living  1         0  SAB IAB Ectopic Multiple Live Births               # Outcome Date GA Lbr Len/2nd Weight Sex Delivery Anes PTL Lv  1 Current            Past Surgical History:  Procedure Laterality Date   UMBILICAL HERNIA REPAIR     Family History  Problem Relation Age of Onset   Stomach cancer Maternal Grandmother    Cancer Paternal Grandmother    Social History   Tobacco Use   Smoking status: Former    Packs/day: 0.25    Types: Cigarettes    Quit date: 05/03/2021    Years since quitting: 0.5   Smokeless tobacco: Never  Vaping Use   Vaping Use: Never used  Substance Use Topics   Alcohol use: Never   Drug use: Never   Allergies  Allergen Reactions   Cough Syrup [Guaifenesin]     Tachycardia, actual medication was Tox-Sol   No medications prior to admission.   I have reviewed patient's Past Medical Hx, Surgical Hx, Family Hx, Social Hx, medications and allergies.   ROS:  Pertinent items noted in HPI and remainder of comprehensive ROS otherwise negative.   Physical Exam  Patient Vitals for the past 24 hrs:  BP Temp Temp src Pulse Resp SpO2 Height Weight  11/09/21 1809 100/60 -- -- 80 -- -- -- --  11/09/21  1632 117/71 -- -- 94 -- -- -- --  11/09/21 1619 114/67 97.9 F (36.6 C) Oral 84 16 98 % -- --  11/09/21 1615 -- -- -- -- -- -- 5' 4.57" (1.64 m) 166 lb 8 oz (75.5 kg)   Constitutional: Well-developed, well-nourished female in no acute distress.  Cardiovascular: normal rate & rhythm Respiratory: normal effort GI: Abd soft, non-tender, gravid appropriate for gestational age MS: Extremities nontender, no edema, normal ROM Neurologic: Alert and oriented x 4.  GU: no CVA tenderness Pelvic: NEFG, physiologic discharge, no blood  Dilation: Closed Effacement (%): Thick Cervical Position: Posterior Exam by:: j Shakesha Soltau cnm  Fetal Tracing: reactive Baseline: 130 Variability: moderate Accelerations: 15x15 Decelerations: none Toco: relaxed   Labs: Results for orders placed or performed during the hospital encounter of 11/09/21 (from the past 24 hour(s))  Urinalysis, Routine w reflex microscopic Urine, Clean Catch     Status: Abnormal   Collection Time: 11/09/21  4:48 PM  Result Value Ref Range   Color, Urine YELLOW YELLOW   APPearance HAZY (A) CLEAR   Specific Gravity, Urine 1.013 1.005 - 1.030   pH 6.0 5.0 - 8.0   Glucose, UA NEGATIVE NEGATIVE mg/dL   Hgb urine dipstick NEGATIVE NEGATIVE  Bilirubin Urine NEGATIVE NEGATIVE   Ketones, ur NEGATIVE NEGATIVE mg/dL   Protein, ur NEGATIVE NEGATIVE mg/dL   Nitrite NEGATIVE NEGATIVE   Leukocytes,Ua TRACE (A) NEGATIVE   RBC / HPF 0-5 0 - 5 RBC/hpf   WBC, UA 0-5 0 - 5 WBC/hpf   Bacteria, UA NONE SEEN NONE SEEN   Squamous Epithelial / LPF 0-5 0 - 5   Mucus PRESENT    Imaging:  No results found.  MAU Course: Orders Placed This Encounter  Procedures   Urinalysis, Routine w reflex microscopic Urine, Clean Catch   Discharge patient   Meds ordered this encounter  Medications   acetaminophen (TYLENOL) tablet 1,000 mg   MDM: Tylenol and hot pack given for pain with good relief of back pain, still feeling some achiness in her abdomen but  easing with time.   Assessment: 1. Back pain affecting pregnancy in third trimester   2. [redacted] weeks gestation of pregnancy   3. NST (non-stress test) reactive    Plan: Discharge home in stable condition with preterm labor precautions  See AVS for additional education given   Follow-up Information     [facility] Follow up.   Specialty: Obstetrics and Gynecology Why: as scheduled for ongoing prenatal care Contact information: [address] [place] [address] [phone]                Allergies as of 11/09/2021       Reactions   Cough Syrup [guaifenesin]    Tachycardia, actual medication was Tox-Sol        Medication List     TAKE these medications    multivitamin-prenatal 27-0.8 MG Tabs tablet Take 1 tablet by mouth daily at 12 noon.       Edd Arbour, CNM, MSN, IBCLC Certified Nurse Midwife, [facility]

## 2021-11-11 ENCOUNTER — Other Ambulatory Visit: Payer: Self-pay

## 2021-11-11 ENCOUNTER — Ambulatory Visit (INDEPENDENT_AMBULATORY_CARE_PROVIDER_SITE_OTHER): Payer: Self-pay | Admitting: Women's Health

## 2021-11-11 VITALS — BP 127/76 | HR 118 | Wt 169.2 lb

## 2021-11-11 DIAGNOSIS — Z3A31 31 weeks gestation of pregnancy: Secondary | ICD-10-CM

## 2021-11-11 DIAGNOSIS — Z3400 Encounter for supervision of normal first pregnancy, unspecified trimester: Secondary | ICD-10-CM

## 2021-11-11 NOTE — Progress Notes (Addendum)
Subjective:  Stacey Tapia is a 26 y.o. G1P0 at [redacted]w[redacted]d being seen today for ongoing prenatal care.  She is currently monitored for the following issues for this low-risk pregnancy and has Supervision of normal first pregnancy, antepartum on their problem list.  Patient reports no complaints.  Contractions: Irritability. Vag. Bleeding: None.  Movement: Present. Denies leaking of fluid.   The following portions of the patient's history were reviewed and updated as appropriate: allergies, current medications, past family history, past medical history, past social history, past surgical history and problem list. Problem list updated.  Objective:   Vitals:   11/11/21 1538  BP: 127/76  Pulse: (!) 118  Weight: 169 lb 3.2 oz (76.7 kg)    Fetal Status: Fetal Heart Rate (bpm): 151 Fundal Height: 31 cm Movement: Present     General:  Alert, oriented and cooperative. Patient is in no acute distress.  Skin: Skin is warm and dry. No rash noted.   Cardiovascular: Normal heart rate noted  Respiratory: Normal respiratory effort, no problems with respiration noted  Abdomen: Soft, gravid, appropriate for gestational age. Pain/Pressure: Absent     Pelvic: Vag. Bleeding: None     Cervical exam deferred        Extremities: Normal range of motion.  Edema: None  Mental Status: Normal mood and affect. Normal behavior. Normal judgment and thought content.   Urinalysis:      Assessment and Plan:  Pregnancy: G1P0 at [redacted]w[redacted]d  1. Supervision of normal first pregnancy, antepartum  -no NOB labs from previous provider available for review. As patient is 31 weeks without NOB labs, will reorder today. Pt verbalizes understanding and agrees with plan. Husband and best friend of patient present for entire visit. -new PHQ/GAD completed  today as not done at 28 week visit  PHQ9 SCORE ONLY 07/20/2021  PHQ-9 Total Score 0   No flowsheet data found.  Preterm labor symptoms and general obstetric precautions  including but not limited to vaginal bleeding, contractions, leaking of fluid and fetal movement were reviewed in detail with the patient. I discussed the assessment and treatment plan with the patient. The patient was provided an opportunity to ask questions and all were answered. The patient agreed with the plan and demonstrated an understanding of the instructions. The patient was advised to call back or seek an in-person office evaluation/go to MAU at [facility] for any urgent or concerning symptoms. Please refer to After Visit Summary for other counseling recommendations.  Return in about 3 weeks (around 12/02/2021) for in-person LOB/APP OK.   Amalie Koran, Odie Sera, NP

## 2021-11-11 NOTE — Patient Instructions (Signed)
Maternity Assessment Unit (MAU)  The Maternity Assessment Unit (MAU) is located at the [facility] at [facility]. The address is: [address], [address], [place], [place] [address]. Please see map below for additional directions.    The Maternity Assessment Unit is designed to help you during your pregnancy, and for up to 6 weeks after delivery, with any pregnancy- or postpartum-related emergencies, if you think you are in labor, or if your water has broken. For example, if you experience nausea and vomiting, vaginal bleeding, severe abdominal or pelvic pain, elevated blood pressure or other problems related to your pregnancy or postpartum time, please come to the Maternity Assessment Unit for assistance.        

## 2021-11-12 LAB — CBC/D/PLT+RPR+RH+ABO+RUBIGG...
Antibody Screen: NEGATIVE
Basophils Absolute: 0.1 10*3/uL (ref 0.0–0.2)
Basos: 0 %
EOS (ABSOLUTE): 0.1 10*3/uL (ref 0.0–0.4)
Eos: 1 %
HCV Ab: <0.1 s/co ratio (ref 0.0–0.9)
HIV Screen 4th Generation wRfx: NONREACTIVE
Hematocrit: 33.5 % — ABNORMAL LOW (ref 34.0–46.6)
Hemoglobin: 11.1 g/dL (ref 11.1–15.9)
Hepatitis B Surface Ag: NEGATIVE
Immature Grans (Abs): 0.2 10*3/uL — ABNORMAL HIGH (ref 0.0–0.1)
Immature Granulocytes: 2 %
Lymphocytes Absolute: 2.1 10*3/uL (ref 0.7–3.1)
Lymphs: 17 %
MCH: 28.1 pg (ref 26.6–33.0)
MCHC: 33.1 g/dL (ref 31.5–35.7)
MCV: 85 fL (ref 79–97)
Monocytes Absolute: 0.9 10*3/uL (ref 0.1–0.9)
Monocytes: 7 %
Neutrophils Absolute: 9.1 10*3/uL — ABNORMAL HIGH (ref 1.4–7.0)
Neutrophils: 73 %
Platelets: 373 10*3/uL (ref 150–450)
RBC: 3.95 x10E6/uL (ref 3.77–5.28)
RDW: 12.5 % (ref 11.7–15.4)
RPR Ser Ql: NONREACTIVE
Rh Factor: POSITIVE
Rubella Antibodies, IGG: 3.57 index (ref >0.99)
WBC: 12.4 10*3/uL — ABNORMAL HIGH (ref 3.4–10.8)

## 2021-11-12 LAB — HCV INTERPRETATION

## 2021-11-24 ENCOUNTER — Inpatient Hospital Stay (HOSPITAL_COMMUNITY)
Admission: AD | Admit: 2021-11-24 | Discharge: 2021-11-24 | Disposition: A | Discharge status: Home / Self Care | Payer: No Typology Code available for payment source | Attending: Obstetrics and Gynecology | Admitting: Obstetrics and Gynecology

## 2021-11-24 ENCOUNTER — Other Ambulatory Visit: Payer: Self-pay

## 2021-11-24 ENCOUNTER — Encounter (HOSPITAL_COMMUNITY): Payer: Self-pay | Admitting: Obstetrics and Gynecology

## 2021-11-24 DIAGNOSIS — Z3689 Encounter for other specified antenatal screening: Secondary | ICD-10-CM

## 2021-11-24 DIAGNOSIS — O26899 Other specified pregnancy related conditions, unspecified trimester: Secondary | ICD-10-CM

## 2021-11-24 DIAGNOSIS — Z0371 Encounter for suspected problem with amniotic cavity and membrane ruled out: Secondary | ICD-10-CM

## 2021-11-24 DIAGNOSIS — O368130 Decreased fetal movements, third trimester, not applicable or unspecified: Secondary | ICD-10-CM

## 2021-11-24 DIAGNOSIS — R109 Unspecified abdominal pain: Secondary | ICD-10-CM

## 2021-11-24 DIAGNOSIS — O26893 Other specified pregnancy related conditions, third trimester: Secondary | ICD-10-CM | POA: Insufficient documentation

## 2021-11-24 DIAGNOSIS — Z87891 Personal history of nicotine dependence: Secondary | ICD-10-CM | POA: Insufficient documentation

## 2021-11-24 DIAGNOSIS — Z3A33 33 weeks gestation of pregnancy: Secondary | ICD-10-CM

## 2021-11-24 LAB — URINALYSIS, MICROSCOPIC (REFLEX)

## 2021-11-24 LAB — URINALYSIS, ROUTINE W REFLEX MICROSCOPIC
Bilirubin Urine: NEGATIVE
Glucose, UA: NEGATIVE mg/dL
Hgb urine dipstick: NEGATIVE
Ketones, ur: 15 mg/dL — AB
Nitrite: NEGATIVE
Protein, ur: NEGATIVE mg/dL
Specific Gravity, Urine: >1.03 — ABNORMAL HIGH (ref 1.005–1.030)
pH: 6 (ref 5.0–8.0)

## 2021-11-24 LAB — WET PREP, GENITAL
Clue Cells Wet Prep HPF POC: NONE SEEN
Sperm: NONE SEEN
Trich, Wet Prep: NONE SEEN
WBC, Wet Prep HPF POC: >=10 — AB (ref <10)
Yeast Wet Prep HPF POC: NONE SEEN

## 2021-11-24 LAB — POCT FERN TEST: POCT Fern Test: NEGATIVE

## 2021-11-24 NOTE — MAU Note (Signed)
This morning she woke up with cramps.  Had some cramping during the week, but lasted all day.  Baby not moving not as much.  Belly would get very tight. Noted that her pants were wet all day today, just a little, enough to be wet.

## 2021-11-24 NOTE — MAU Provider Note (Signed)
History     CSN: [id number]  Arrival date and time: 11/24/21 1742   Event Date/Time   First Provider Initiated Contact with Patient 11/24/21 1849      Chief Complaint  Patient presents with   Abdominal Pain        Vaginal Discharge        HPI Stacey Tapia is a 26 y.o. G1P0 at [redacted]w[redacted]d who presents with abdominal cramping, vaginal discharge, and decreased fetal movement. Symptoms started today.  Reports intermittent lower abdominal cramping that feels like period cramping. Can't tell how frequent it's occurring because she was at work & wasn't timing it. Rates pain 3/10. Hasn't treated symptoms. Denies fever, dysuria, n/v/d, or vaginal bleeding. Reports feeling like her underwear has been wet all day but hasn't noticed any gushes of fluid & can't tell what the discharge looks like. Reports decrease in fetal movement today. Says baby normally is very active but just not as active since this morning. Can't quantify movement.   OB History     Gravida  1   Para      Term      Preterm      AB      Living  0      SAB      IAB      Ectopic      Multiple      Live Births              Past Medical History:  Diagnosis Date   Anxiety     Past Surgical History:  Procedure Laterality Date   UMBILICAL HERNIA REPAIR      Family History  Problem Relation Age of Onset   Stomach cancer Maternal Grandmother    Cancer Paternal Grandmother     Social History   Tobacco Use   Smoking status: Former    Packs/day: 0.25    Types: Cigarettes    Quit date: 05/03/2021    Years since quitting: 0.5   Smokeless tobacco: Never  Vaping Use   Vaping Use: Never used  Substance Use Topics   Alcohol use: Never   Drug use: Never    Allergies:  Allergies  Allergen Reactions   Cough Syrup [Guaifenesin]     Tachycardia, actual medication was Tox-Sol    Medications Prior to Admission  Medication Sig Dispense Refill Last Dose   Prenatal Vit-Fe Fumarate-FA  (MULTIVITAMIN-PRENATAL) 27-0.8 MG TABS tablet Take 1 tablet by mouth daily at 12 noon.   11/24/2021    Review of Systems  Constitutional: Negative.   Gastrointestinal:  Positive for abdominal pain. Negative for constipation, diarrhea, nausea and vomiting.  Genitourinary:  Positive for vaginal discharge. Negative for dysuria and vaginal bleeding.  Physical Exam   Blood pressure 121/78, pulse (!) 103, temperature 98.7 F (37.1 C), temperature source Oral, resp. rate 16, height 5' 4.5" (1.638 m), weight 76.5 kg, last menstrual period 03/04/2021, SpO2 100 %.  Physical Exam Vitals and nursing note reviewed. Exam conducted with a chaperone present.  Constitutional:      General: She is not in acute distress.    Appearance: She is well-developed.  HENT:     Head: Normocephalic and atraumatic.  Eyes:     General: No scleral icterus.    Pupils: Pupils are equal, round, and reactive to light.  Pulmonary:     Effort: Pulmonary effort is normal. No respiratory distress.  Abdominal:     Palpations: Abdomen is soft.  Tenderness: There is no abdominal tenderness.  Genitourinary:    Comments: Pelvic: NEFG, scant mucoid discharge, no pooling, no blood  Dilation: Closed Effacement (%): Thick Cervical Position: Posterior  Skin:    General: Skin is warm and dry.  Neurological:     General: No focal deficit present.     Mental Status: She is alert.  Psychiatric:        Mood and Affect: Mood normal.        Behavior: Behavior normal.   NST:  Baseline: 140 bpm, Variability: Good {> 6 bpm), Accelerations: Reactive, and Decelerations: Absent  MAU Course  Procedures Results for orders placed or performed during the hospital encounter of 11/24/21 (from the past 24 hour(s))  Urinalysis, Routine w reflex microscopic Urine, Clean Catch     Status: Abnormal   Collection Time: 11/24/21  6:27 PM  Result Value Ref Range   Color, Urine YELLOW YELLOW   APPearance CLEAR CLEAR   Specific Gravity,  Urine >1.030 (H) 1.005 - 1.030   pH 6.0 5.0 - 8.0   Glucose, UA NEGATIVE NEGATIVE mg/dL   Hgb urine dipstick NEGATIVE NEGATIVE   Bilirubin Urine NEGATIVE NEGATIVE   Ketones, ur 15 (A) NEGATIVE mg/dL   Protein, ur NEGATIVE NEGATIVE mg/dL   Nitrite NEGATIVE NEGATIVE   Leukocytes,Ua TRACE (A) NEGATIVE  Urinalysis, Microscopic (reflex)     Status: Abnormal   Collection Time: 11/24/21  6:27 PM  Result Value Ref Range   RBC / HPF 0-5 0 - 5 RBC/hpf   WBC, UA 0-5 0 - 5 WBC/hpf   Bacteria, UA FEW (A) NONE SEEN   Squamous Epithelial / LPF 0-5 0 - 5   Mucus PRESENT   Wet prep, genital     Status: Abnormal   Collection Time: 11/24/21  6:57 PM   Specimen: Vaginal  Result Value Ref Range   Yeast Wet Prep HPF POC NONE SEEN NONE SEEN   Trich, Wet Prep NONE SEEN NONE SEEN   Clue Cells Wet Prep HPF POC NONE SEEN NONE SEEN   WBC, Wet Prep HPF POC >=10 (A) <10   Sperm NONE SEEN   POCT fern test     Status: Normal   Collection Time: 11/24/21  6:58 PM  Result Value Ref Range   POCT Fern Test Negative = intact amniotic membranes     MDM Abdominal cramping - no regular contractions on monitor. Abdomen soft/non tender. Cervix closed/thick. Looks like slightly dehydrated per urine - discussed eating & drinking at home. Urine culture sent per sticky note request.   Vaginal discharge - sterile speculum exam performed & no pooling or abnormal discharge seen. Fern slide negative. Wet prep negative. GC/CT pending  Decreased fetal movement - reactive tracing while in MAU. Patient documented 30+ movements in 1.5 hrs.   Assessment and Plan   1. Encounter for suspected PROM, with rupture of membranes not found  -no pooling & negative fern -GC/CT pending  2. Abdominal cramping affecting pregnancy  -discussed good oral intake -reviewed PTL precautions & reasons to return to MAU -urine culture pending  3. Decreased fetal movements in third trimester, single or unspecified fetus  -reviewed kick counts  4.  NST (non-stress test) reactive   5. [redacted] weeks gestation of pregnancy     Judeth Horn 11/24/2021, 7:52 PM

## 2021-11-25 ENCOUNTER — Ambulatory Visit (INDEPENDENT_AMBULATORY_CARE_PROVIDER_SITE_OTHER): Payer: No Typology Code available for payment source | Admitting: Family Medicine

## 2021-11-25 ENCOUNTER — Encounter: Payer: Self-pay | Admitting: Family Medicine

## 2021-11-25 VITALS — BP 112/71 | HR 91 | Wt 169.0 lb

## 2021-11-25 DIAGNOSIS — Z3400 Encounter for supervision of normal first pregnancy, unspecified trimester: Secondary | ICD-10-CM

## 2021-11-25 DIAGNOSIS — Z3A33 33 weeks gestation of pregnancy: Secondary | ICD-10-CM

## 2021-11-25 LAB — GC/CHLAMYDIA PROBE AMP (~~LOC~~) NOT AT ARMC
Chlamydia: NEGATIVE
Comment: NEGATIVE
Comment: NORMAL
Neisseria Gonorrhea: NEGATIVE

## 2021-11-25 NOTE — Progress Notes (Signed)
° °  PRENATAL VISIT NOTE  Subjective:  Stacey Tapia is a 26 y.o. G1P0 at [redacted]w[redacted]d being seen today for ongoing prenatal care.  She is currently monitored for the following issues for this low-risk pregnancy and has Supervision of normal first pregnancy, antepartum on their problem list.  Patient reports having some cramping recently. Was seen in the MAU for this last night. Cervix was closed, workup reassuring, urine culture pending. Wondering what else she can use to help with this. Cramping pain is mainly on the sides of her lower abdomen. She also feels the need to urinate a lot more, no dysuria or suprapubic pain. Contractions: Irregular. Vag. Bleeding: None.  Movement: Present. Denies leaking of fluid.   The following portions of the patient's history were reviewed and updated as appropriate: allergies, current medications, past family history, past medical history, past social history, past surgical history and problem list.   Objective:   Vitals:   11/25/21 1632  BP: 112/71  Pulse: 91  Weight: 169 lb (76.7 kg)    Fetal Status: Fetal Heart Rate (bpm): 159 Fundal Height: 33 cm Movement: Present  General:  Alert, oriented and cooperative. Patient is in no acute distress.  Skin: Skin is warm and dry. No rash noted.   Cardiovascular: Normal heart rate noted.  Respiratory: Normal respiratory effort, no problems with respiration noted.  Abdomen: Soft, gravid, appropriate for gestational age.       Pelvic: Cervical exam deferred.  Extremities: Normal range of motion.  Edema: None.  Mental Status: Normal mood and affect. Normal behavior. Normal judgment and thought content.   Assessment and Plan:  Pregnancy: G1P0 at [redacted]w[redacted]d  1. Supervision of normal first pregnancy, antepartum 2. [redacted] weeks gestation of pregnancy Progressing well. FH and FHT within normal limits. Discussed that cramping is likely due to [facility] contractions and round ligament pain. Reviewed ways to help with  this and recommended trial of abdominal binder. Will follow up for next OB visit in 2 weeks.   Preterm labor symptoms and general obstetric precautions including but not limited to vaginal bleeding, contractions, leaking of fluid and fetal movement were reviewed in detail with the patient.  Please refer to After Visit Summary for other counseling recommendations.   Return in about 2 weeks (around 12/09/2021) for follow up LR OB visit.  Future Appointments  Date Time Provider Department Center  12/10/2021  9:35 AM Gerrit Heck, CNM [facility] None  01/28/2022 12:30 PM [facility] [place] [facility]   Worthy Rancher, MD

## 2021-11-25 NOTE — Progress Notes (Signed)
ROB  c/o cramps

## 2021-11-27 ENCOUNTER — Encounter: Payer: Self-pay | Admitting: Women's Health

## 2021-11-27 DIAGNOSIS — O35EXX0 Maternal care for other (suspected) fetal abnormality and damage, fetal genitourinary anomalies, not applicable or unspecified: Secondary | ICD-10-CM | POA: Insufficient documentation

## 2021-11-27 LAB — CULTURE, OB URINE
Culture: 20000 — AB
Special Requests: NORMAL

## 2021-11-28 ENCOUNTER — Telehealth: Payer: Self-pay | Admitting: Certified Nurse Midwife

## 2021-11-28 NOTE — Telephone Encounter (Signed)
No answer, VM not set up. MyChart not active.

## 2021-12-10 ENCOUNTER — Other Ambulatory Visit: Payer: Self-pay

## 2021-12-10 ENCOUNTER — Ambulatory Visit (INDEPENDENT_AMBULATORY_CARE_PROVIDER_SITE_OTHER): Payer: No Typology Code available for payment source

## 2021-12-10 VITALS — BP 102/68 | HR 90 | Wt 174.0 lb

## 2021-12-10 DIAGNOSIS — O35EXX0 Maternal care for other (suspected) fetal abnormality and damage, fetal genitourinary anomalies, not applicable or unspecified: Secondary | ICD-10-CM

## 2021-12-10 DIAGNOSIS — Z3A35 35 weeks gestation of pregnancy: Secondary | ICD-10-CM

## 2021-12-10 DIAGNOSIS — Z3400 Encounter for supervision of normal first pregnancy, unspecified trimester: Secondary | ICD-10-CM

## 2021-12-10 DIAGNOSIS — O2343 Unspecified infection of urinary tract in pregnancy, third trimester: Secondary | ICD-10-CM

## 2021-12-10 MED ORDER — CEFADROXIL 500 MG PO CAPS: 500.0000 mg | ORAL_CAPSULE | Freq: Two times a day (BID) | ORAL | 0 refills | Status: DC

## 2021-12-10 NOTE — Progress Notes (Signed)
Pt presents for ROB reports no complaints today.  

## 2021-12-10 NOTE — Progress Notes (Signed)
LOW-RISK PREGNANCY OFFICE VISIT  Patient name: Stacey Tapia MRN [id number]  Date of birth: August 09, 1996 Chief Complaint:   Routine Prenatal Visit  Subjective:   Stacey Tapia is a 26 y.o. G1P0 female at [redacted]w[redacted]d with an Estimated Date of Delivery: 01/10/22 being seen today for ongoing management of a Low-risk pregnancy aeb has Supervision of normal first pregnancy, antepartum and Fetal renal anomaly, single gestation on their problem list.  Patient presents today with occasional contractions.  She states she has contractions and cramping throughout the day with some pelvic pain. She also reports some difficulty sleeping, but does not work.  Patient endorses fetal movement. Patient denies abdominal cramping or contractions.  Patient denies vaginal concerns including abnormal discharge, leaking of fluid, and bleeding. She denies issues with urination, constipation, or diarrhea.   Contractions: Irregular. Vag. Bleeding: None.  Movement: Present.  Reviewed past medical,surgical, social, obstetrical and family history as well as problem list, medications and allergies.  Objective   Vitals:   12/10/21 1003  BP: 102/68  Pulse: 90  Weight: 174 lb (78.9 kg)  Body mass index is 29.41 kg/m.  Total Weight Gain:69 lb (31.3 kg)         Physical Examination:   General appearance: Well appearing, and in no distress  Mental status: Alert, oriented to person, place, and time  Skin: Warm & dry  Cardiovascular: Normal heart rate noted  Respiratory: Normal respiratory effort, no distress  Abdomen: Soft, gravid, nontender, AGA with Fundal Height: 35 cm  Pelvic: Cervical exam deferred           Extremities: Edema: None  Fetal Status: Fetal Heart Rate (bpm): 154   Movement: Present   No results found for this or any previous visit (from the past 24 hour(s)).  Assessment & Plan:  Low-risk pregnancy of a 26 y.o., G1P0 at [redacted]w[redacted]d with an Estimated Date of Delivery: 01/10/22   1. Supervision of  normal first pregnancy, antepartum -Anticipatory guidance for upcoming appts. -Patient to schedule next appt in 1 weeks for an in-person visit. -Educated on GBS bacteria including what it is, why we test, and how and when we treat if needed.  2. Fetal renal anomaly, single gestation -Discussed US findings and MFM recommendation for repeat US. -Informed that [place] should have been completed around 32 weeks. -However, will attempt to get scheduled ASAP. -Appt scheduled for Monday Feb 6th at 1030.  3. [redacted] weeks gestation of pregnancy -Doing well. -Reassured that normal to have contractions/cramping. -Encouraged rest when she can.  Will consider sleep aide if worsening of condition.  4. UTI (urinary tract infection) during pregnancy, third trimester -Informed of findings from MAU visit. -Further informed that provider unable to contact patient or leave message. -Strongly encouraged activation of mychart for communication with office and reviewing of labs.  -Rx for Duricef sent to pharmacy on file.  -Plan for TOC around 39 weeks.  -Pyelo precautions given.     Meds:  Meds ordered this encounter  Medications   cefadroxil (DURICEF) 500 MG capsule    Sig: Take 1 capsule (500 mg total) by mouth 2 (two) times daily.    Dispense:  14 capsule    Refill:  0    Order Specific Question:   Supervising Provider    Answer:   Reva Bores [[id number]]   Labs/procedures today:  Lab Orders  No laboratory test(s) ordered today     Reviewed: Preterm labor symptoms and general obstetric precautions including but not limited  to vaginal bleeding, contractions, leaking of fluid and fetal movement were reviewed in detail with the patient.  All questions were answered.  Follow-up: Return in about 1 week (around 12/17/2021) for LROB with GBS.  No orders of the defined types were placed in this encounter.  Cherre Robins MSN, CNM 12/10/2021

## 2021-12-12 NOTE — Progress Notes (Signed)
° °  PRENATAL VISIT NOTE  Subjective:  Stacey Tapia is a 26 y.o. G1P0 at [redacted]w[redacted]d being seen today for ongoing prenatal care.  She is currently monitored for the following issues for this low-risk pregnancy and has Supervision of normal first pregnancy, antepartum and Fetal renal anomaly, single gestation on their problem list.  Patient reports no complaints except N/V with her ABX for her UTI. She will continue it. If N/V persists she will let us know.   Contractions: Irregular. Vag. Bleeding: None.  Movement: Present. Denies leaking of fluid.   The following portions of the patient's history were reviewed and updated as appropriate: allergies, current medications, past family history, past medical history, past social history, past surgical history and problem list.   Objective:   Vitals:   12/17/21 0836  BP: 107/75  Pulse: 83  Weight: 179 lb 6.4 oz (81.4 kg)    Fetal Status: Fetal Heart Rate (bpm): 155 Fundal Height: 36 cm Movement: Present  Presentation: Vertex  General:  Alert, oriented and cooperative. Patient is in no acute distress.  Skin: Skin is warm and dry. No rash noted.   Cardiovascular: Normal heart rate noted  Respiratory: Normal respiratory effort, no problems with respiration noted  Abdomen: Soft, gravid, appropriate for gestational age.  Pain/Pressure: Present     Pelvic: Cervical exam performed in the presence of a chaperone Dilation: 1 Effacement (%): 50 Station: -3  Extremities: Normal range of motion.  Edema: None  Mental Status: Normal mood and affect. Normal behavior. Normal judgment and thought content.   Assessment and Plan:  Pregnancy: G1P0 at [redacted]w[redacted]d 1. Fetal renal anomaly, single gestation - F/u scan at [place] shows normal kidneys now. Growth normal at 55%ile.  - TOC for UTI at 38w.   2. Supervision of normal first pregnancy, antepartum - GBS done today  Preterm labor symptoms and general obstetric precautions including but not limited to  vaginal bleeding, contractions, leaking of fluid and fetal movement were reviewed in detail with the patient. Please refer to After Visit Summary for other counseling recommendations.   Return in about 1 week (around 12/24/2021) for OB VISIT, MD or APP.  Future Appointments  Date Time Provider Department Center  01/28/2022 12:30 PM [facility] [place] [facility]    Milas Hock, MD

## 2021-12-17 ENCOUNTER — Ambulatory Visit (INDEPENDENT_AMBULATORY_CARE_PROVIDER_SITE_OTHER): Payer: No Typology Code available for payment source | Admitting: Obstetrics and Gynecology

## 2021-12-17 ENCOUNTER — Other Ambulatory Visit (HOSPITAL_COMMUNITY)
Admission: RE | Admit: 2021-12-17 | Discharge: 2021-12-17 | Disposition: A | Discharge status: Home / Self Care | Payer: No Typology Code available for payment source | Source: Ambulatory Visit | Attending: Obstetrics and Gynecology | Admitting: Obstetrics and Gynecology

## 2021-12-17 ENCOUNTER — Other Ambulatory Visit: Payer: Self-pay

## 2021-12-17 ENCOUNTER — Encounter: Payer: Self-pay | Admitting: Obstetrics and Gynecology

## 2021-12-17 VITALS — BP 107/75 | HR 83 | Wt 179.4 lb

## 2021-12-17 DIAGNOSIS — O35EXX0 Maternal care for other (suspected) fetal abnormality and damage, fetal genitourinary anomalies, not applicable or unspecified: Secondary | ICD-10-CM

## 2021-12-17 DIAGNOSIS — Z3400 Encounter for supervision of normal first pregnancy, unspecified trimester: Secondary | ICD-10-CM

## 2021-12-17 LAB — OB RESULTS CONSOLE GC/CHLAMYDIA: Gonorrhea: NEGATIVE

## 2021-12-18 LAB — CERVICOVAGINAL ANCILLARY ONLY
Chlamydia: NEGATIVE
Comment: NEGATIVE
Comment: NORMAL
Neisseria Gonorrhea: NEGATIVE

## 2021-12-19 LAB — STREP GP B NAA: Strep Gp B NAA: NEGATIVE

## 2021-12-24 ENCOUNTER — Other Ambulatory Visit: Payer: Self-pay

## 2021-12-24 ENCOUNTER — Encounter: Payer: Self-pay | Admitting: Obstetrics

## 2021-12-24 ENCOUNTER — Ambulatory Visit (INDEPENDENT_AMBULATORY_CARE_PROVIDER_SITE_OTHER): Payer: No Typology Code available for payment source | Admitting: Obstetrics

## 2021-12-24 VITALS — BP 110/77 | HR 95 | Wt 179.4 lb

## 2021-12-24 DIAGNOSIS — Z3400 Encounter for supervision of normal first pregnancy, unspecified trimester: Secondary | ICD-10-CM

## 2021-12-24 DIAGNOSIS — O35EXX0 Maternal care for other (suspected) fetal abnormality and damage, fetal genitourinary anomalies, not applicable or unspecified: Secondary | ICD-10-CM

## 2021-12-24 NOTE — Progress Notes (Signed)
Subjective:  Stacey Tapia is a 26 y.o. G1P0 at [redacted]w[redacted]d being seen today for ongoing prenatal care.  She is currently monitored for the following issues for this low-risk pregnancy and has Supervision of normal first pregnancy, antepartum and Fetal renal anomaly, single gestation on their problem list.  Patient reports no complaints.   .  .  Movement: Present. Denies leaking of fluid.   The following portions of the patient's history were reviewed and updated as appropriate: allergies, current medications, past family history, past medical history, past social history, past surgical history and problem list. Problem list updated.  Objective:   Vitals:   12/24/21 0910  BP: 110/77  Pulse: 95  Weight: 179 lb 6.4 oz (81.4 kg)    Fetal Status: Fetal Heart Rate (bpm): 152   Movement: Present     General:  Alert, oriented and cooperative. Patient is in no acute distress.  Skin: Skin is warm and dry. No rash noted.   Cardiovascular: Normal heart rate noted  Respiratory: Normal respiratory effort, no problems with respiration noted  Abdomen: Soft, gravid, appropriate for gestational age. Pain/Pressure: Present     Pelvic:  Cervical exam deferred        Extremities: Normal range of motion.  Edema: Trace  Mental Status: Normal mood and affect. Normal behavior. Normal judgment and thought content.   Urinalysis:      Assessment and Plan:  Pregnancy: G1P0 at [redacted]w[redacted]d  1. Supervision of normal first pregnancy, antepartum - urine culture   2. Fetal renal anomaly, single gestation, resolved - [facility] fetal kidneys [facility] on 12-16-2021   Preterm labor symptoms and general obstetric precautions including but not limited to vaginal bleeding, contractions, leaking of fluid and fetal movement were reviewed in detail with the patient. Please refer to After Visit Summary for other counseling recommendations.   Return in about 1 week (around 12/31/2021) for ROB.   Shelly Bombard, MD  12/24/21

## 2021-12-24 NOTE — Progress Notes (Signed)
ROB 37.4 wks No unusual complaints Some UC's, not regular or increasingly painful

## 2021-12-25 ENCOUNTER — Encounter (HOSPITAL_COMMUNITY): Payer: Self-pay | Admitting: Obstetrics & Gynecology

## 2021-12-25 ENCOUNTER — Inpatient Hospital Stay (HOSPITAL_COMMUNITY)
Admission: AD | Admit: 2021-12-25 | Discharge: 2021-12-25 | Disposition: A | Discharge status: Home / Self Care | Payer: No Typology Code available for payment source | Attending: Obstetrics & Gynecology | Admitting: Obstetrics & Gynecology

## 2021-12-25 DIAGNOSIS — Z3689 Encounter for other specified antenatal screening: Secondary | ICD-10-CM | POA: Insufficient documentation

## 2021-12-25 DIAGNOSIS — O26893 Other specified pregnancy related conditions, third trimester: Secondary | ICD-10-CM | POA: Insufficient documentation

## 2021-12-25 DIAGNOSIS — N898 Other specified noninflammatory disorders of vagina: Secondary | ICD-10-CM | POA: Insufficient documentation

## 2021-12-25 DIAGNOSIS — Z3493 Encounter for supervision of normal pregnancy, unspecified, third trimester: Secondary | ICD-10-CM

## 2021-12-25 DIAGNOSIS — Z3A37 37 weeks gestation of pregnancy: Secondary | ICD-10-CM

## 2021-12-25 LAB — POCT FERN TEST: POCT Fern Test: NEGATIVE

## 2021-12-25 NOTE — Progress Notes (Signed)
Patient given discharge instructions, return for evaluation if ROM, VB, decreased FM. Keep scheduled appointment. Patinet and significant other verbalizes understanding.

## 2021-12-25 NOTE — MAU Provider Note (Signed)
Event Date/Time   First Provider Initiated Contact with Patient 12/25/21 1342     S [name] is a 26 y.o. G1P0000 pregnant female who presents to MAU today with complaint of blood tinged vaginal discharge since last night, she had an office visit with a cervical exam yesterday. Denies cramping or contractions, has regular and vigorous fetal movement. No other physical complaints.  Receives care at [facility], prenatal records reviewed.  Pertinent items noted in HPI and remainder of comprehensive ROS otherwise negative.   O BP 117/72 (BP Location: Left Arm)    Pulse 87    Temp 98.2 F (36.8 C) (Oral)    Resp 17    LMP 03/04/2021 (Approximate)    SpO2 98% Comment: Room Air Physical Exam Vitals and nursing note reviewed.  Constitutional:      General: She is not in acute distress.    Appearance: Normal appearance. She is normal weight. She is not ill-appearing.  HENT:     Head: Normocephalic and atraumatic.  Eyes:     Pupils: Pupils are equal, round, and reactive to light.  Cardiovascular:     Rate and Rhythm: Normal rate and regular rhythm.  Pulmonary:     Effort: Pulmonary effort is normal.  Abdominal:     Palpations: Abdomen is soft.  Genitourinary:    General: Normal vulva.     Vagina: No vaginal discharge (difficult to get enough discharge for the slide, what was seen was physiologic).  Musculoskeletal:        General: Normal range of motion.  Skin:    General: Skin is warm and dry.     Capillary Refill: Capillary refill takes less than 2 seconds.  Neurological:     Mental Status: She is alert and oriented to person, place, and time.  Psychiatric:        Mood and Affect: Mood normal.        Behavior: Behavior normal.        Thought Content: Thought content normal.        Judgment: Judgment normal.   Fern slide negative, very little discharge noted on exam  Reassured patient that discharge after a cervical exam is expected and advised her to report if  she has cramping or contractions. Pt and FOB understanding.  A Vaginal discharge, third trimester Amniotic membranes intact NST reactive [redacted] weeks gestation of pregnancy  P Discharge from MAU in stable condition with term labor precautions Follow up at [facility] as scheduled for ongoing prenatal care  Gabriel Carina, CNM 12/25/2021 3:16 PM

## 2021-12-25 NOTE — MAU Note (Signed)
.  Stacey Tapia is a 26 y.o. at [redacted]w[redacted]d here in MAU reporting: blood tinged mucus discharge since last night.  Patient had prenatal visit 2/16 with sve of 2/50/-3.  Patient reports + FM and no UC's.  Pregnancy has overall been uncomplicated.  Patient indicated she has taken medication for UTI and completed course 2 weeks ago.   Vitals:   12/25/21 1313  BP: 123/70  Pulse: 86  Resp: 18  Temp: 98.9 F (37.2 C)

## 2021-12-26 LAB — URINE CULTURE, OB REFLEX

## 2021-12-26 LAB — CULTURE, OB URINE

## 2021-12-28 ENCOUNTER — Encounter (HOSPITAL_COMMUNITY): Payer: Self-pay | Admitting: Obstetrics and Gynecology

## 2021-12-28 ENCOUNTER — Inpatient Hospital Stay (HOSPITAL_COMMUNITY): Payer: Medicaid Other | Admitting: Anesthesiology

## 2021-12-28 ENCOUNTER — Inpatient Hospital Stay (HOSPITAL_COMMUNITY)
Admission: AD | Admit: 2021-12-28 | Discharge: 2021-12-30 | DRG: 807 | Disposition: A | Discharge status: Home / Self Care | Payer: Medicaid Other | Attending: Obstetrics and Gynecology | Admitting: Obstetrics and Gynecology

## 2021-12-28 ENCOUNTER — Other Ambulatory Visit: Payer: Self-pay

## 2021-12-28 DIAGNOSIS — Z87891 Personal history of nicotine dependence: Secondary | ICD-10-CM | POA: Diagnosis not present

## 2021-12-28 DIAGNOSIS — O26893 Other specified pregnancy related conditions, third trimester: Secondary | ICD-10-CM | POA: Diagnosis present

## 2021-12-28 DIAGNOSIS — O35EXX0 Maternal care for other (suspected) fetal abnormality and damage, fetal genitourinary anomalies, not applicable or unspecified: Secondary | ICD-10-CM

## 2021-12-28 DIAGNOSIS — Z3A38 38 weeks gestation of pregnancy: Secondary | ICD-10-CM

## 2021-12-28 DIAGNOSIS — Z20822 Contact with and (suspected) exposure to covid-19: Secondary | ICD-10-CM | POA: Diagnosis present

## 2021-12-28 DIAGNOSIS — O4202 Full-term premature rupture of membranes, onset of labor within 24 hours of rupture: Secondary | ICD-10-CM | POA: Diagnosis not present

## 2021-12-28 DIAGNOSIS — Z3400 Encounter for supervision of normal first pregnancy, unspecified trimester: Secondary | ICD-10-CM

## 2021-12-28 LAB — CBC
HCT: 35.6 % — ABNORMAL LOW (ref 36.0–46.0)
Hemoglobin: 11.4 g/dL — ABNORMAL LOW (ref 12.0–15.0)
MCH: 25.8 pg — ABNORMAL LOW (ref 26.0–34.0)
MCHC: 32 g/dL (ref 30.0–36.0)
MCV: 80.5 fL (ref 80.0–100.0)
Platelets: 355 10*3/uL (ref 150–400)
RBC: 4.42 MIL/uL (ref 3.87–5.11)
RDW: 13.5 % (ref 11.5–15.5)
WBC: 11.2 10*3/uL — ABNORMAL HIGH (ref 4.0–10.5)
nRBC: 0 % (ref 0.0–0.2)

## 2021-12-28 LAB — RESP PANEL BY RT-PCR (FLU A&B, COVID) ARPGX2
Influenza A by PCR: NEGATIVE
Influenza B by PCR: NEGATIVE
SARS Coronavirus 2 by RT PCR: NEGATIVE

## 2021-12-28 LAB — POCT FERN TEST: POCT Fern Test: POSITIVE

## 2021-12-28 LAB — RPR: RPR Ser Ql: NONREACTIVE

## 2021-12-28 LAB — TYPE AND SCREEN
ABO/RH(D): A POS
Antibody Screen: NEGATIVE

## 2021-12-28 MED ORDER — OXYCODONE-ACETAMINOPHEN 5-325 MG PO TABS: 1.0000 | ORAL_TABLET | ORAL | Status: DC | PRN

## 2021-12-28 MED ORDER — PHENYLEPHRINE 40 MCG/ML (10ML) SYRINGE FOR IV PUSH (FOR BLOOD PRESSURE SUPPORT): 80.0000 ug | PREFILLED_SYRINGE | INTRAVENOUS | Status: DC | PRN

## 2021-12-28 MED ORDER — OXYTOCIN BOLUS FROM INFUSION
333.0000 mL | Freq: Once | INTRAVENOUS | Status: AC
  Administered 2021-12-28: 333 mL via INTRAVENOUS

## 2021-12-28 MED ORDER — OXYTOCIN-SODIUM CHLORIDE 30-0.9 UT/500ML-% IV SOLN: 2.5000 [IU]/h | INTRAVENOUS | Status: DC

## 2021-12-28 MED ORDER — OXYTOCIN-SODIUM CHLORIDE 30-0.9 UT/500ML-% IV SOLN
1.0000 m[IU]/min | INTRAVENOUS | Status: DC
  Administered 2021-12-28: 2 m[IU]/min via INTRAVENOUS
  Filled 2021-12-28: qty 500

## 2021-12-28 MED ORDER — DIPHENHYDRAMINE HCL 25 MG PO CAPS
25.0000 mg | ORAL_CAPSULE | Freq: Four times a day (QID) | ORAL | Status: DC | PRN
Start: 2021-12-28 — End: 2021-12-30

## 2021-12-28 MED ORDER — SOD CITRATE-CITRIC ACID 500-334 MG/5ML PO SOLN: 30.0000 mL | ORAL | Status: DC | PRN

## 2021-12-28 MED ORDER — ONDANSETRON HCL 4 MG/2ML IJ SOLN: 4.0000 mg | INTRAMUSCULAR | Status: DC | PRN

## 2021-12-28 MED ORDER — COCONUT OIL OIL: 1.0000 "application " | TOPICAL_OIL | Status: DC | PRN

## 2021-12-28 MED ORDER — WITCH HAZEL-GLYCERIN EX PADS: 1.0000 "application " | MEDICATED_PAD | CUTANEOUS | Status: DC | PRN

## 2021-12-28 MED ORDER — EPHEDRINE 5 MG/ML INJ: 10.0000 mg | INTRAVENOUS | Status: DC | PRN

## 2021-12-28 MED ORDER — FENTANYL-BUPIVACAINE-NACL 0.5-0.125-0.9 MG/250ML-% EP SOLN
12.0000 mL/h | EPIDURAL | Status: DC | PRN
  Administered 2021-12-28: 12 mL/h via EPIDURAL
  Filled 2021-12-28: qty 250

## 2021-12-28 MED ORDER — TETANUS-DIPHTH-ACELL PERTUSSIS 5-2.5-18.5 LF-MCG/0.5 IM SUSY: 0.5000 mL | PREFILLED_SYRINGE | Freq: Once | INTRAMUSCULAR | Status: DC

## 2021-12-28 MED ORDER — LIDOCAINE HCL (PF) 1 % IJ SOLN
INTRAMUSCULAR | Status: DC | PRN
  Administered 2021-12-28: 10 mL via EPIDURAL

## 2021-12-28 MED ORDER — ACETAMINOPHEN 325 MG PO TABS
650.0000 mg | ORAL_TABLET | ORAL | Status: DC | PRN
  Administered 2021-12-28: 650 mg via ORAL
  Filled 2021-12-28: qty 2

## 2021-12-28 MED ORDER — FENTANYL CITRATE (PF) 100 MCG/2ML IJ SOLN: 100.0000 ug | INTRAMUSCULAR | Status: DC | PRN

## 2021-12-28 MED ORDER — LACTATED RINGERS IV SOLN: INTRAVENOUS | Status: DC

## 2021-12-28 MED ORDER — PHENYLEPHRINE 40 MCG/ML (10ML) SYRINGE FOR IV PUSH (FOR BLOOD PRESSURE SUPPORT)
80.0000 ug | PREFILLED_SYRINGE | INTRAVENOUS | Status: DC | PRN
  Filled 2021-12-28: qty 10

## 2021-12-28 MED ORDER — DIBUCAINE (PERIANAL) 1 % EX OINT: 1.0000 "application " | TOPICAL_OINTMENT | CUTANEOUS | Status: DC | PRN

## 2021-12-28 MED ORDER — ACETAMINOPHEN 325 MG PO TABS: 650.0000 mg | ORAL_TABLET | ORAL | Status: DC | PRN

## 2021-12-28 MED ORDER — PRENATAL 27-0.8 MG PO TABS: 1.0000 | ORAL_TABLET | Freq: Every day | ORAL | Status: DC

## 2021-12-28 MED ORDER — ONDANSETRON HCL 4 MG PO TABS: 4.0000 mg | ORAL_TABLET | ORAL | Status: DC | PRN

## 2021-12-28 MED ORDER — SENNOSIDES-DOCUSATE SODIUM 8.6-50 MG PO TABS
2.0000 | ORAL_TABLET | ORAL | Status: DC
  Administered 2021-12-29 – 2021-12-30 (×2): 2 via ORAL
  Filled 2021-12-28 (×2): qty 2

## 2021-12-28 MED ORDER — BENZOCAINE-MENTHOL 20-0.5 % EX AERO
1.0000 "application " | INHALATION_SPRAY | CUTANEOUS | Status: DC | PRN
  Filled 2021-12-28: qty 112

## 2021-12-28 MED ORDER — LACTATED RINGERS IV SOLN
500.0000 mL | Freq: Once | INTRAVENOUS | Status: AC
  Administered 2021-12-28: 500 mL via INTRAVENOUS

## 2021-12-28 MED ORDER — IBUPROFEN 600 MG PO TABS
600.0000 mg | ORAL_TABLET | Freq: Four times a day (QID) | ORAL | Status: DC
  Administered 2021-12-28 – 2021-12-30 (×7): 600 mg via ORAL
  Filled 2021-12-28 (×7): qty 1

## 2021-12-28 MED ORDER — DIPHENHYDRAMINE HCL 50 MG/ML IJ SOLN: 12.5000 mg | INTRAMUSCULAR | Status: DC | PRN

## 2021-12-28 MED ORDER — LIDOCAINE HCL (PF) 1 % IJ SOLN: 30.0000 mL | INTRAMUSCULAR | Status: DC | PRN

## 2021-12-28 MED ORDER — LACTATED RINGERS IV SOLN: 500.0000 mL | INTRAVENOUS | Status: DC | PRN

## 2021-12-28 MED ORDER — ONDANSETRON HCL 4 MG/2ML IJ SOLN: 4.0000 mg | Freq: Four times a day (QID) | INTRAMUSCULAR | Status: DC | PRN

## 2021-12-28 MED ORDER — SIMETHICONE 80 MG PO CHEW: 80.0000 mg | CHEWABLE_TABLET | ORAL | Status: DC | PRN

## 2021-12-28 MED ORDER — PRENATAL MULTIVITAMIN CH
1.0000 | ORAL_TABLET | Freq: Every day | ORAL | Status: DC
  Administered 2021-12-29 – 2021-12-30 (×2): 1 via ORAL
  Filled 2021-12-28 (×2): qty 1

## 2021-12-28 MED ORDER — TERBUTALINE SULFATE 1 MG/ML IJ SOLN: 0.2500 mg | Freq: Once | INTRAMUSCULAR | Status: DC | PRN

## 2021-12-28 MED ORDER — OXYCODONE-ACETAMINOPHEN 5-325 MG PO TABS: 2.0000 | ORAL_TABLET | ORAL | Status: DC | PRN

## 2021-12-28 NOTE — Progress Notes (Signed)
Patient Vitals for the past 4 hrs:  BP Temp Temp src Pulse Resp  12/28/21 1531 99/63 -- -- (!) 116 --  12/28/21 1526 108/64 -- -- 69 --  12/28/21 1521 (!) 102/54 -- -- 66 --  12/28/21 1516 (!) 102/52 -- -- 73 --  12/28/21 1511 (!) 103/53 -- -- 93 --  12/28/21 1506 110/62 98.5 F (36.9 C) Oral 81 16  12/28/21 1501 99/78 -- -- 97 --  12/28/21 1458 125/63 -- -- 86 --  12/28/21 1456 125/63 -- -- 86 16  12/28/21 1450 121/80 -- -- 86 --  12/28/21 1437 95/82 -- -- 72 --  12/28/21 1334 124/72 -- -- 87 --  12/28/21 1301 114/72 -- -- (!) 110 --  12/28/21 1214 116/69 98.8 F (37.1 C) Oral 72 18   Comfortable w/epidural. No change in cx. Ctx still irregular. Pitocin at 10 mu/min. IUPC placed.  Will titrate pitocin until labor adequate.

## 2021-12-28 NOTE — MAU Note (Signed)
..  Stacey Tapia is a 26 y.o. at [redacted]w[redacted]d here in MAU reporting: possible SROM large gush @0200  with clear and pink tinged fluid and Ctx every 3 mins since 0300. Pt denies DFM, VB, abnormal discharge, no recent intercourse, PIH s/s, and complication in pregnancy.  GBS neg SVE on last Thursday 2 cm Onset of complaint: 0200 Pain score: 5/10 Vitals:   12/28/21 0339  BP: 119/83  Pulse: 99  Resp: 18  Temp: 98.3 F (36.8 C)  SpO2: 99%     FHT:140 Lab orders placed from triage:  none

## 2021-12-28 NOTE — Anesthesia Preprocedure Evaluation (Signed)
Anesthesia Evaluation  Patient identified by MRN, date of birth, ID band Patient awake    Reviewed: Allergy & Precautions, H&P , Patient's Chart, lab work & pertinent test results  Airway Mallampati: I       Dental no notable dental hx.    Pulmonary former smoker,    Pulmonary exam normal        Cardiovascular negative cardio ROS Normal cardiovascular exam     Neuro/Psych negative neurological ROS     GI/Hepatic negative GI ROS, Neg liver ROS,   Endo/Other  negative endocrine ROS  Renal/GU   negative genitourinary   Musculoskeletal negative musculoskeletal ROS (+)   Abdominal (+) + obese,   Peds  Hematology  (+) Blood dyscrasia, anemia ,   Anesthesia Other Findings   Reproductive/Obstetrics (+) Pregnancy                             Anesthesia Physical Anesthesia Plan  ASA: 2  Anesthesia Plan: Epidural   Post-op Pain Management:    Induction:   PONV Risk Score and Plan:   Airway Management Planned:   Additional Equipment:   Intra-op Plan:   Post-operative Plan:   Informed Consent: I have reviewed the patients History and Physical, chart, labs and discussed the procedure including the risks, benefits and alternatives for the proposed anesthesia with the patient or authorized representative who has indicated his/her understanding and acceptance.       Plan Discussed with:   Anesthesia Plan Comments:         Anesthesia Quick Evaluation

## 2021-12-28 NOTE — Anesthesia Procedure Notes (Signed)
Epidural Patient location during procedure: OB Start time: 12/28/2021 2:45 PM End time: 12/28/2021 2:50 PM  Staffing Anesthesiologist: Leilani Able, MD Performed: anesthesiologist   Preanesthetic Checklist Completed: patient identified, IV checked, site marked, risks and benefits discussed, surgical consent, monitors and equipment checked, pre-op evaluation and timeout performed  Epidural Patient position: sitting Prep: DuraPrep and site prepped and draped Patient monitoring: continuous pulse ox and blood pressure Approach: midline Location: L3-L4 Injection technique: LOR air  Needle:  Needle type: Tuohy  Needle gauge: 17 G Needle length: 9 cm and 9 Needle insertion depth: 6 cm Catheter type: closed end flexible Catheter size: 19 Gauge Catheter at skin depth: 11 cm Test dose: negative and Other  Assessment Events: blood not aspirated, injection not painful, no injection resistance, no paresthesia and negative IV test  Additional Notes Reason for block:procedure for pain

## 2021-12-28 NOTE — Discharge Summary (Signed)
Postpartum Discharge Summary   Patient Name: Stacey Tapia DOB: Jun 19, 1996 MRN: [id number]  Date of admission: 12/28/2021 Delivery date:12/28/2021  Delivering provider: Christin Fudge  Date of discharge: 12/30/2021  Admitting diagnosis: Indication for care in labor or delivery [O75.9] Intrauterine pregnancy: [redacted]w[redacted]d    Secondary diagnosis:  Principal Problem:   Vaginal delivery Active Problems:   Supervision of normal first pregnancy, antepartum   Fetal renal anomaly, single gestation  Additional problems: None     Discharge diagnosis: Term Pregnancy Delivered                                              Post partum procedures: None Augmentation: AROM and Pitocin (forebag AROM) Complications: None  Hospital course: Onset of Labor With Vaginal Delivery      26y.o. yo G1P0000 at 358w1das admitted in Latent Labor on 12/28/2021. Patient had an uncomplicated labor course as follows:  Membrane Rupture Time/Date: 8:55 AM ,12/28/2021   Delivery Method:Vaginal, Spontaneous  Episiotomy: None  Lacerations:  2nd degree  Patient had an uncomplicated postpartum course.  She is ambulating, tolerating a regular diet, passing flatus, and urinating well.  She is breastfeeding well.  Patient is discharged home in stable condition on 12/30/21.  Newborn Data: Birth date:12/28/2021  Birth time:7:12 PM  Gender:Female  Living status:Living  Apgars:9 ,9  Weight:3380 g   Magnesium Sulfate received: No BMZ received: No Rhophylac: N/A MMR: N/A T-DaP: Declined Flu: Declined Transfusion: No  Physical exam  Vitals:   12/29/21 0600 12/29/21 1000 12/29/21 1531 12/30/21 0635  BP: 108/77 110/67 106/71 114/80  Pulse: 65 (!) 53 80   Resp: 18 16 17    Temp: 98.5 F (36.9 C) 98 F (36.7 C) 97.9 F (36.6 C)   TempSrc: Oral Oral Oral   SpO2: 98% 98% 96%   Weight:      Height:       General: alert, cooperative, and no distress Lochia: appropriate Uterine Fundus: firm and below  umbilicus  DVT Evaluation: no LE edema or calf tenderness to palpation  Labs: Lab Results  Component Value Date   WBC 11.2 (H) 12/28/2021   HGB 11.4 (L) 12/28/2021   HCT 35.6 (L) 12/28/2021   MCV 80.5 12/28/2021   PLT 355 12/28/2021   CMP Latest Ref Rng & Units 05/15/2021  Glucose 70 - 99 mg/dL 84  BUN 6 - 20 mg/dL 12  Creatinine 0.44 - 1.00 mg/dL 0.62  Sodium 135 - 145 mmol/L 135  Potassium 3.5 - 5.1 mmol/L 3.9  Chloride 98 - 111 mmol/L 103  CO2 22 - 32 mmol/L 25  Calcium 8.9 - 10.3 mg/dL 8.9  Total Protein 6.5 - 8.1 g/dL 6.4(L)  Total Bilirubin 0.3 - 1.2 mg/dL 0.8  Alkaline Phos 38 - 126 U/L 40  AST 15 - 41 U/L 19  ALT 0 - 44 U/L 21   Edinburgh Score: Edinburgh Postnatal Depression Scale Screening Tool 12/28/2021  I have been able to laugh and see the funny side of things. 0  I have looked forward with enjoyment to things. 0  I have blamed myself unnecessarily when things went wrong. 0  I have been anxious or worried for no good reason. 0  I have felt scared or panicky for no good reason. 0  Things have been getting on top of me. 0  I  have been so unhappy that I have had difficulty sleeping. 0  I have felt sad or miserable. 0  I have been so unhappy that I have been crying. 0  The thought of harming myself has occurred to me. 0  Edinburgh Postnatal Depression Scale Total 0     After visit meds:  Allergies as of 12/30/2021       Reactions   Cough Syrup [guaifenesin] Other (See Comments)   Tachycardia, actual medication was Tox-Sol        Medication List     TAKE these medications    acetaminophen 500 MG tablet Commonly known as: TYLENOL Take 2 tablets (1,000 mg total) by mouth every 8 (eight) hours as needed (pain).   ibuprofen 600 MG tablet Commonly known as: ADVIL Take 1 tablet (600 mg total) by mouth every 6 (six) hours as needed (pain).   multivitamin-prenatal 27-0.8 MG Tabs tablet Take 1 tablet by mouth daily at 12 noon.         Discharge  home in stable condition Infant Feeding: Breast Infant Disposition: home with mother Discharge instruction: per After Visit Summary and Postpartum booklet. Activity: Advance as tolerated. Pelvic rest for 6 weeks.  Diet: routine diet Future Appointments: Future Appointments  Date Time Provider [facility]  01/28/2022 12:30 PM [facility] [place] 1 [facility]  01/29/2022 10:55 AM Chancy Milroy, MD [place] None   Follow up Visit: Please schedule this patient for a Virtual postpartum visit in 4 weeks with the following provider: Any provider. Additional Postpartum F/U: None  Low risk pregnancy complicated by: None  Delivery mode:  Vaginal, Spontaneous  Anticipated Birth Control:   Vasectomy  12/30/2021 Genia Del, MD

## 2021-12-28 NOTE — H&P (Addendum)
OBSTETRIC ADMISSION HISTORY AND PHYSICAL  Stacey Tapia is a 26 y.o. female G1P0000 with IUP at [redacted]w[redacted]d by 5-week [place] presenting for SROM/SOL. Reporting increasing contractions throughout day followed by SROM around 0200. She reports +FMs, no VB, no blurry vision, headaches, peripheral edema, or RUQ pain.  She plans on breast feeding. She is planning on partner vasectomy for contraception postpartum.  She received her prenatal care at [facility].  Dating: By 5-week [place] --->  Estimated Date of Delivery: 01/10/22  Sono:   @[redacted]w[redacted]d , CWD, normal anatomy, cephalic presentation, anterior placental lie, 3046 g, 55% EFW   Prenatal History/Complications: None  Past Medical History: Past Medical History:  Diagnosis Date   Anxiety     Past Surgical History: Past Surgical History:  Procedure Laterality Date   UMBILICAL HERNIA REPAIR      Obstetrical History: OB History     Gravida  1   Para  0   Term  0   Preterm  0   AB  0   Living  0      SAB  0   IAB  0   Ectopic  0   Multiple  0   Live Births  0           Social History Social History   Socioeconomic History   Marital status: Married    Spouse name: Not on file   Number of children: Not on file   Years of education: Not on file   Highest education level: High school graduate  Occupational History   Not on file  Tobacco Use   Smoking status: Former    Packs/day: 0.25    Types: Cigarettes    Quit date: 05/03/2021    Years since quitting: 0.6   Smokeless tobacco: Never  Vaping Use   Vaping Use: Never used  Substance and Sexual Activity   Alcohol use: Never   Drug use: Never   Sexual activity: Yes  Other Topics Concern   Not on file  Social History Narrative   Not on file   Social Determinants of Health   Financial Resource Strain: Not on file  Food Insecurity: No Food Insecurity   Worried About Running Out of Food in the Last Year: Never true   Ran Out of Food in the Last  Year: Never true  Transportation Needs: No Transportation Needs   Lack of Transportation (Medical): No   Lack of Transportation (Non-Medical): No  Physical Activity: Not on file  Stress: Not on file  Social Connections: Not on file    Family History: Family History  Problem Relation Age of Onset   Stomach cancer Maternal Grandmother    Cancer Paternal Grandmother     Allergies: Allergies  Allergen Reactions   Cough Syrup [Guaifenesin]     Tachycardia, actual medication was Tox-Sol    Medications Prior to Admission  Medication Sig Dispense Refill Last Dose   cefadroxil (DURICEF) 500 MG capsule Take 1 capsule (500 mg total) by mouth 2 (two) times daily. 14 capsule 0    Prenatal Vit-Fe Fumarate-FA (MULTIVITAMIN-PRENATAL) 27-0.8 MG TABS tablet Take 1 tablet by mouth daily at 12 noon.        Review of Systems   All systems reviewed and negative except as stated in HPI  Blood pressure 119/83, pulse 99, temperature 98.3 F (36.8 C), resp. rate 18, height 5\' 4"  (1.626 m), weight 83.9 kg, last menstrual period 03/04/2021, SpO2 99 %. General appearance: alert, cooperative, and  no distress Lungs: Normal work of breathing on room air Heart: Regular rate, warm and well-perfused Abdomen: soft, non-tender; gravid Extremities: No LE edema, no calf tenderness to palpation  Presentation: cephalic, confirmed by bedside US Fetal monitoring: Baseline FHR 135 bpm, moderate variability, +accels, no decels Uterine activity: Regular, every 2-4 minutes     Prenatal labs: ABO, Rh: A/Positive/-- (01/04 1624) Antibody: Negative (01/04 1624) Rubella: 3.57 (01/04 1624) RPR: Non Reactive (01/04 1624)  HBsAg: Negative (01/04 1624)  HIV: Non Reactive (01/04 1624)  GBS: Negative/-- (02/09 0849)  1 hr Glucola: Normal Genetic screening: AFP negative Anatomy US: Fetal pyelectasis, resolved on repeat at [place] at [redacted]w[redacted]d in [place]  Prenatal Transfer Tool  Maternal Diabetes: No Genetic Screening:  Normal AFP, declined NIPS Maternal Ultrasounds/Referrals: Fetal Kidney Anomalies, resolved Fetal Ultrasounds or other Referrals:  None Maternal Substance Abuse:  No Significant Maternal Medications:  None Significant Maternal Lab Results: Group B Strep negative  Results for orders placed or performed during the hospital encounter of 12/28/21 (from the past 24 hour(s))  POCT fern test   Collection Time: 12/28/21  4:03 AM  Result Value Ref Range   POCT Fern Test Positive = ruptured amniotic membanes     Patient Active Problem List   Diagnosis Date Noted   Fetal renal anomaly, single gestation 11/27/2021   Supervision of normal first pregnancy, antepartum 07/20/2021    Assessment/Plan:  Stacey Tapia is a 26 y.o. G1P0000 at [redacted]w[redacted]d here for SROM/SOL. She has had an uncomplicated prenatal course.  #Labor: SROM around 0200 with clear, pink-tinged fluid. Progressing well, plan for expectant management. Can consider pitocin if no progression or contractions space out. Will reassess in 4 hours. #Pain: Planning for epidural #FWB: Category 1 #ID: GBS negative #MOF: Breast #MOC: Vasectomy  Myrna Blazer, DO PGY-1 12/28/2021, 4:10 AM    Attestation of CNM Supervision of Resident: Evaluation and management procedures were performed by the [facility] Medicine Resident under my supervision. I was immediately available for direct supervision, assistance and direction throughout this encounter.  I also confirm that I have verified the information documented in the residents note, and that I have also personally reperformed the pertinent components of the physical exam and all of the medical decision making activities.  I have also made any necessary editorial changes.  Brand Males, CNM 12/28/2021 5:54 AM

## 2021-12-28 NOTE — Progress Notes (Signed)
Patient Vitals for the past 4 hrs:  BP Temp Temp src Pulse Resp  12/28/21 1831 (!) 103/43 -- -- 96 --  12/28/21 1803 117/72 98 F (36.7 C) Oral 78 16  12/28/21 1731 112/69 -- -- 69 16  12/28/21 1631 113/61 -- -- 92 16  12/28/21 1612 (!) 94/53 -- -- 73 --  12/28/21 1531 99/63 -- -- (!) 116 16  12/28/21 1526 108/64 -- -- 69 --  12/28/21 1521 (!) 102/54 -- -- 66 --  12/28/21 1516 (!) 102/52 -- -- 73 --  12/28/21 1511 (!) 103/53 -- -- 93 --  12/28/21 1506 110/62 98.5 F (36.9 C) Oral 81 16  12/28/21 1501 99/78 -- -- 97 --  12/28/21 1458 125/63 -- -- 86 --  12/28/21 1456 125/63 -- -- 86 16  12/28/21 1450 121/80 -- -- 86 --   Feeling some pressure. Cx 9/100/-1 per RN.  MVUs ~ 220, FHR Cat 1. Continue present mgt.

## 2021-12-28 NOTE — Progress Notes (Signed)
Patient Vitals for the past 4 hrs:  BP Temp Temp src Pulse Resp  12/28/21 0859 129/80 98.7 F (37.1 C) Oral 91 18  12/28/21 0634 110/73 98.5 F (36.9 C) Oral 90 --   States ctx are getting stronger and closer together.  FHR Cat 1. Ctx q 2-3 minutes.  Cx now 5/100/-2/BBOW.  Forebag AROM'd, clear fluid.  Continue present mgt.

## 2021-12-28 NOTE — Progress Notes (Signed)
Ctx have now spaced out to q 2-7 minutes.  FHR Cat 1.  Pt amenable to augmentation. Will start pitocin.

## 2021-12-29 NOTE — Lactation Note (Signed)
This note was copied from a baby's chart. Lactation Consultation Note  Patient Name: Stacey Tapia [id number] Date: 12/29/2021   Age:25 hours Per RN, mom declined [facility] but would like to be seen by [facility] services  in the morning.  Maternal Data    Feeding Nipple Type: Slow - flow  LATCH Score                    Lactation Tools Discussed/Used    Interventions    Discharge    Consult Status      Vicente Serene 12/29/2021, 12:21 AM

## 2021-12-29 NOTE — Anesthesia Postprocedure Evaluation (Signed)
Anesthesia Post Note  Patient: Stacey Tapia  Procedure(s) Performed: AN AD HOC LABOR EPIDURAL     Patient location during evaluation: Mother Baby Anesthesia Type: Epidural Level of consciousness: awake, oriented and awake and alert Pain management: pain level controlled Vital Signs Assessment: post-procedure vital signs reviewed and stable Respiratory status: spontaneous breathing, respiratory function stable and nonlabored ventilation Cardiovascular status: stable Postop Assessment: no headache, adequate PO intake, able to ambulate, patient able to bend at knees and no apparent nausea or vomiting Anesthetic complications: no   No notable events documented.  Last Vitals:  Vitals:   12/29/21 0600 12/29/21 1000  BP: 108/77 110/67  Pulse: 65 (!) 53  Resp: 18 16  Temp: 36.9 C 36.7 C  SpO2: 98% 98%    Last Pain:  Vitals:   12/29/21 1000  TempSrc: Oral  PainSc:    Pain Goal:                   [name]

## 2021-12-29 NOTE — Progress Notes (Signed)
Post Partum Day 1 Subjective: no complaints, up ad lib, voiding and tolerating PO, small lochia, plans to breastfeed, plans to bottle feed,  plans vasectomy  Objective: Blood pressure 108/77, pulse 65, temperature 98.5 F (36.9 C), temperature source Oral, resp. rate 18, height 5\' 4"  (1.626 m), weight 83.9 kg, last menstrual period 03/04/2021, SpO2 98 %, unknown if currently breastfeeding.  Physical Exam:  General: alert, cooperative and no distress Lochia:normal flow Chest: CTAB Heart: RRR no m/r/g Abdomen: +BS, soft, nontender,  Uterine Fundus: firm DVT Evaluation: No evidence of DVT seen on physical exam. Extremities: no edema  Recent Labs    12/28/21 0418  HGB 11.4*  HCT 35.6*    Assessment/Plan: Plan for discharge tomorrow, Breastfeeding, and Lactation consult   LOS: 1 day   12/30/21 12/29/2021, 7:54 AM

## 2021-12-29 NOTE — Social Work (Signed)
CSW received consult for hx of Anxiety. CSW met with MOB to offer support and complete assessment.    MOB declined to use the Spanish interpreter.   CSW met with MOB at bedside and introduced CSW role. CSW observed MOB holding the infant and FOB present at bedside. MOB welcomed CSW visit and reported that FOB would help translate. MOB identified FOB as her support. CSW inquired how MOB has felt since giving birth. MOB reported that she felt good and shared the L&D was "short and nice." CSW inquired about MOB history of anxiety. MOB reported she was diagnosed with anxiety in June 2022, " My body just felt like I was in shock, and I went to the emergency room." MOB did not go into detail regarding what triggered the anxiety. MOB reported she was prescribed medication to treat anxiety however she did not take the medication. MOB reported she has not had any concern with anxiety since then and reported feeling emotionally well during the pregnancy. MOB reported that she has no history of therapy and is not currently interested. CSW provided education regarding the baby blues period vs. perinatal mood disorders, discussed treatment and gave resources for mental health follow up if concerns arise.  CSW recommended MOB complete a self-evaluation during the postpartum time period using the New Mom Checklist from Postpartum Progress and encouraged MOB to contact a medical professional if symptoms are noted at any time. MOB reported she feels comfortable reaching out to her provider if concerns arise.   MOB reported she essential items for the infant including a bassinet where the infant will sleep. CSW provided review of Sudden Infant Death Syndrome (SIDS) precautions. MOB and FOB to decide on a pediatrician for the infant follow up care. CSW assessed MOB for additional needs. MOB reported no further need.   CSW identifies no further need for intervention and no barriers to discharge at this time.   Stacey Tapia, MSW, LCSW [facility] [place] Worker  [phone] 12/29/2021  2:34 PM

## 2021-12-29 NOTE — Lactation Note (Signed)
This note was copied from a baby's chart. Lactation Consultation Note  Patient Name: Stacey Tapia [id number] Date: 12/29/2021 Reason for consult: Initial assessment;1st time breastfeeding;Primapara;Early term 37-38.6wks Age:26 hours  LC in to visit with P1 Mom of ET infant. Baby at 2% weight loss.  Mom is choosing to breast and formula feed.  Baby has had several formula feedings by bottle because of "no milk" in her breasts.  Asked Mom if LC could demonstrated hand expression and assist with positioning baby at the breast.  Mom agreeable.  Baby latched easily with guidance from [facility].  Baby noted to be sucking with deep jaw extensions.  Mom denied discomfort.  Basic teaching done on importance of latching baby to the breast before formula feeding.  Encouraged Mom to exclusively breastfeed to support a full milk supply.  Supply meets demand reviewed.  Encouraged STS with baby as much as possible and offering breast with feeding cues.   Mom encouraged to ask for help prn.   Maternal Data Has patient been taught Hand Expression?: Yes Does the patient have breastfeeding experience prior to this delivery?: No  Feeding Mother's Current Feeding Choice: Breast Milk and Formula  LATCH Score Latch: Grasps breast easily, tongue down, lips flanged, rhythmical sucking.  Audible Swallowing: A few with stimulation  Type of Nipple: Everted at rest and after stimulation  Comfort (Breast/Nipple): Soft / non-tender  Hold (Positioning): Assistance needed to correctly position infant at breast and maintain latch.  LATCH Score: 8  Interventions Interventions: Breast feeding basics reviewed;Assisted with latch;Skin to skin;Breast massage;Hand express;Breast compression;Adjust position;Support pillows;Position options;LC Services brochure  Discharge Pump: Personal (DEBP gift, not sure of brand (yellow))  Consult Status Consult Status: Follow-up Date: 12/30/21 Follow-up type:  In-patient    Stacey Tapia 12/29/2021, 9:05 AM

## 2021-12-30 MED ORDER — ACETAMINOPHEN 500 MG PO TABS: 1000.0000 mg | ORAL_TABLET | Freq: Three times a day (TID) | ORAL | 0 refills | Status: DC | PRN

## 2021-12-30 MED ORDER — IBUPROFEN 600 MG PO TABS: 600.0000 mg | ORAL_TABLET | Freq: Four times a day (QID) | ORAL | 0 refills | Status: DC | PRN

## 2021-12-30 NOTE — Social Work (Signed)
CSW received verbal consult "MOB has questions about Medicaid."  ° °CSW reached out to the financial counselor and notified them that MOB has specific questions about Medicaid. FC will follow up with MOB.  ° ° °Mattison Golay, MSW, LCSW °[facility]  °Clinical Social Worker  °[phone] °12/30/2021  8:22 AM  °

## 2021-12-30 NOTE — Lactation Note (Signed)
This note was copied from a baby's chart. Lactation Consultation Note  Patient Name: Stacey Tapia [id number] Date: 12/30/2021 Reason for consult: Follow-up assessment;Early term 37-38.6wks;1st time breastfeeding;Primapara Age:25 hours   P1 mother whose infant is now 40 hours old.  This is an ETI at 38+1 weeks.  Mother's current feeding preference is breast/formula.  Mother has been primarily formula feeding.  Reviewed "supply and demand" and encouraged mother to latch with every feeding to help ensure a good milk supply.  Mother feels like she does not have "any milk" and further education provided.  Mother had no questions/concerns related to breast feeding or bottle feeding.  Mother has a DEBP for home use.  Mother interested in pumping some and I suggested pumping for 15 minutes after breast feeding and/or supplementation.  Discussed engorgement prevention/treatment.  No support person present at this time.   Maternal Data Has patient been taught Hand Expression?: Yes Does the patient have breastfeeding experience prior to this delivery?: No  Feeding Mother's Current Feeding Choice: Breast Milk and Formula  LATCH Score                    Lactation Tools Discussed/Used Tools: Coconut oil  Interventions Interventions: Breast feeding basics reviewed;Education  Discharge Discharge Education: Engorgement and breast care Pump: Personal  Consult Status Consult Status: Complete Date: 12/30/21 Follow-up type: Call as needed    [name] R Jonathyn Carothers 12/30/2021, 10:18 AM

## 2021-12-31 ENCOUNTER — Encounter: Payer: No Typology Code available for payment source | Admitting: Obstetrics

## 2022-01-07 ENCOUNTER — Telehealth (HOSPITAL_COMMUNITY): Payer: Self-pay | Admitting: *Deleted

## 2022-01-07 NOTE — Telephone Encounter (Signed)
Voicemail not setup. Unable to leave message. ? ?Odis Hollingshead, RN 01-07-2022 at 10:52am ?

## 2022-01-27 ENCOUNTER — Other Ambulatory Visit: Payer: No Typology Code available for payment source

## 2022-01-28 ENCOUNTER — Ambulatory Visit
Admission: RE | Admit: 2022-01-28 | Discharge: 2022-01-28 | Disposition: A | Discharge status: Home / Self Care | Payer: No Typology Code available for payment source | Source: Ambulatory Visit | Attending: Obstetrics and Gynecology | Admitting: Obstetrics and Gynecology

## 2022-01-28 DIAGNOSIS — N6320 Unspecified lump in the left breast, unspecified quadrant: Secondary | ICD-10-CM

## 2022-01-29 ENCOUNTER — Ambulatory Visit (INDEPENDENT_AMBULATORY_CARE_PROVIDER_SITE_OTHER): Payer: Self-pay | Admitting: Obstetrics and Gynecology

## 2022-01-29 ENCOUNTER — Encounter: Payer: Self-pay | Admitting: Obstetrics and Gynecology

## 2022-01-29 ENCOUNTER — Other Ambulatory Visit: Payer: Self-pay

## 2022-01-29 NOTE — Progress Notes (Signed)
? ? ?  Post Partum Visit Note ? ?Stacey Tapia is a 26 y.o. G21P1001 female who presents for a postpartum visit. She is 4 weeks postpartum following a normal spontaneous vaginal delivery.  I have fully reviewed the prenatal and intrapartum course. The delivery was at 38.1 gestational weeks.  Anesthesia: epidural. Postpartum course has been unremarkable. Baby is doing well. Baby is feeding by both breast and bottle - Gerber Smooth . Bleeding staining only. Bowel function is normal. Bladder function is normal. Patient is not sexually active. Contraception method is abstinence.  Would like to discuss options.  ? ?Postpartum depression screening: negative. ? ? ?The pregnancy intention screening data noted above was reviewed. Potential methods of contraception were discussed. The patient elected to proceed with No data recorded. ? ? ? ?Health Maintenance Due  ?Topic Date Due  ? COVID-19 Vaccine (1) Never done  ? HPV VACCINES (1 - 2-dose series) Never done  ? TETANUS/TDAP  Never done  ? PAP-Cervical Cytology Screening  Never done  ? PAP SMEAR-Modifier  Never done  ? INFLUENZA VACCINE  Never done  ? ? ?Medical record ? ?Review of Systems ?Pertinent items noted in HPI and remainder of comprehensive ROS otherwise negative. ? ?Objective:  ?LMP 03/04/2021 (Approximate)   ? ?General:  alert  ? Breasts:  not indicated  ?Lungs: clear to auscultation bilaterally  ?Heart:  regular rate and rhythm, S1, S2 normal, no murmur, click, rub or gallop  ?Abdomen: soft, non-tender; bowel sounds normal; no masses,  no organomegaly   ?Wound NA  ?GU exam:  normal  ?     ?Assessment:  ? ? There are no diagnoses linked to this encounter. ? ?Nl postpartum exam.  ? ?Plan:  ? ?Essential components of care per ACOG recommendations: ? ?1.  Mood and well being: Patient with negative depression screening today. Reviewed local resources for support.  ?- Patient tobacco use? No.   ?- hx of drug use? No.   ? ?2. Infant care and feeding:  ?-Patient  currently breastmilk feeding? Yes ?-Social determinants of health (SDOH) reviewed in EPIC. No concerns ? ?3. Sexuality, contraception and birth spacing ?- Patient does not want a pregnancy in the next year.  Desired family size is uncertain  ?- Reviewed reproductive life planning. Reviewed contraceptive methods based on pt preferences and effectiveness.  Patient desired Female Condom today.   ?- Discussed birth spacing of 18 months ? ?4. Sleep and fatigue ?-Encouraged family/partner/community support of 4 hrs of uninterrupted sleep to help with mood and fatigue ? ?5. Physical Recovery  ?- Discussed patients delivery and complications. She describes her labor as good. ?- Patient had a Vaginal, no problems at delivery. Patient had a 2nd degree laceration. Perineal healing reviewed. Patient expressed understanding ?- Patient has urinary incontinence? No. ?- Patient is safe to resume physical and sexual activity ? ?6.  Health Maintenance ?- HM due items addressed Yes ?- Last pap smear July 2022 with [facility]. ?Pap smear not done at today's visit.  ?-Breast Cancer screening indicated? No.  ? ?7. Chronic Disease/Pregnancy Condition follow up: None ? ?- PCP follow up ? ?Arlina Robes, MD ?[facility] ? ?

## 2022-01-29 NOTE — Patient Instructions (Signed)

## 2022-04-18 IMAGING — US US OB < 14 WEEKS - US OB TV
1 series · 15 of 28 positions shown · non-contrast
Comparison: None.

CLINICAL DATA: Abdominal pain for 1 week. Clinical gestational age
of 10 weeks and 2 days.

EXAM:
OBSTETRIC <14 WK US AND TRANSVAGINAL OB US
TECHNIQUE: Both transabdominal and transvaginal ultrasound examinations were
performed for complete evaluation of the gestation as well as the
maternal uterus, adnexal regions, and pelvic cul-de-sac.
Transvaginal technique was performed to assess early pregnancy.

[Series 1: us ob < 14 weeks - us ob tv · 15 of 61 slices shown]
[im 1/61]
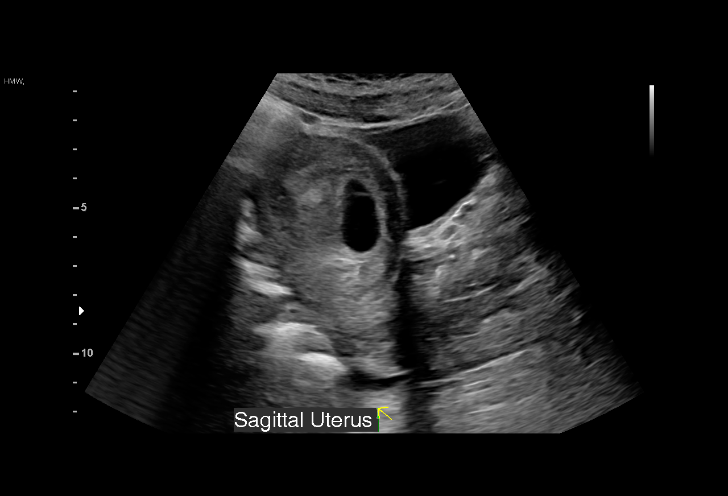
[im 5/61]
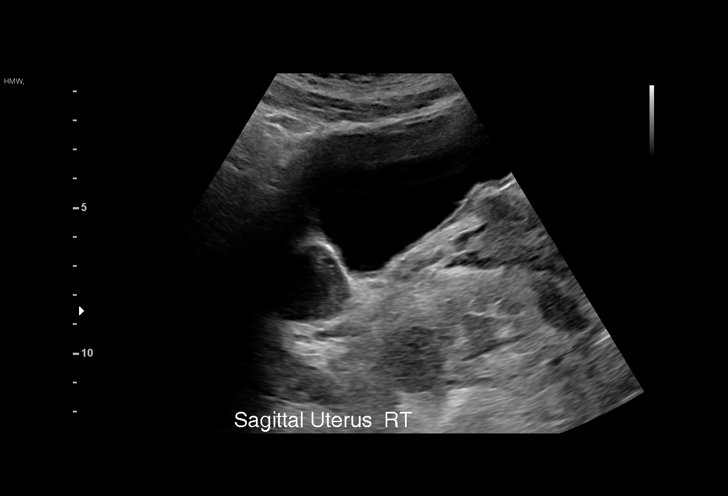
[im 9/61]
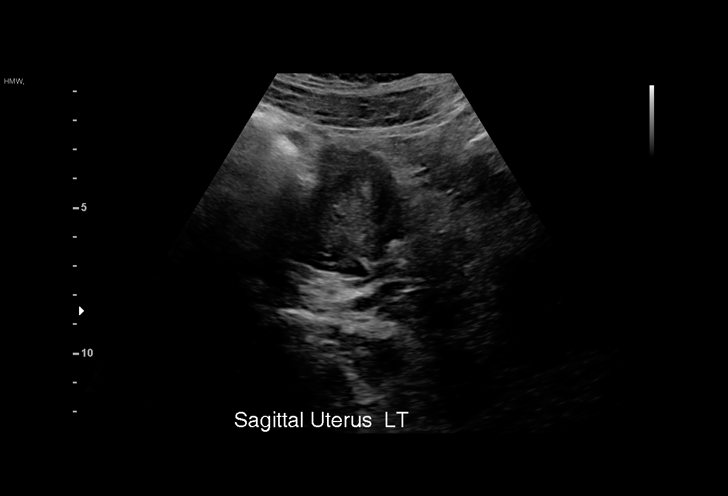
[im 14/61]
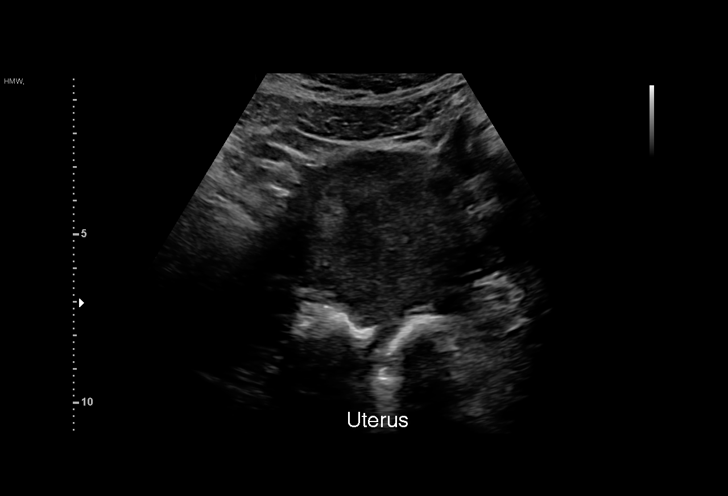
[im 18/61]
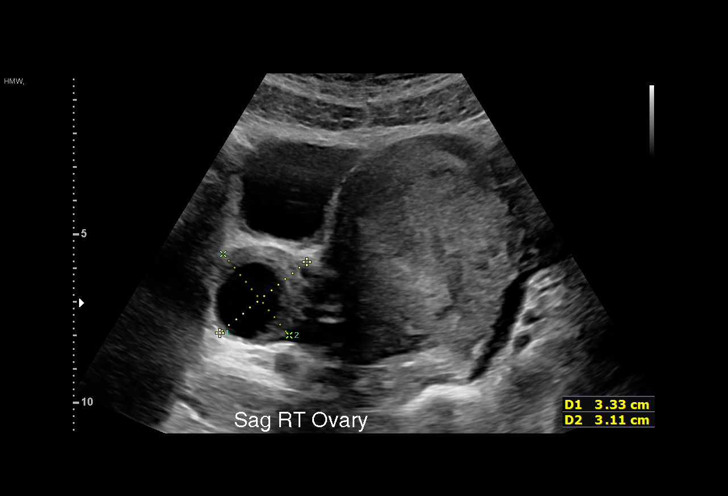
[im 23/61]
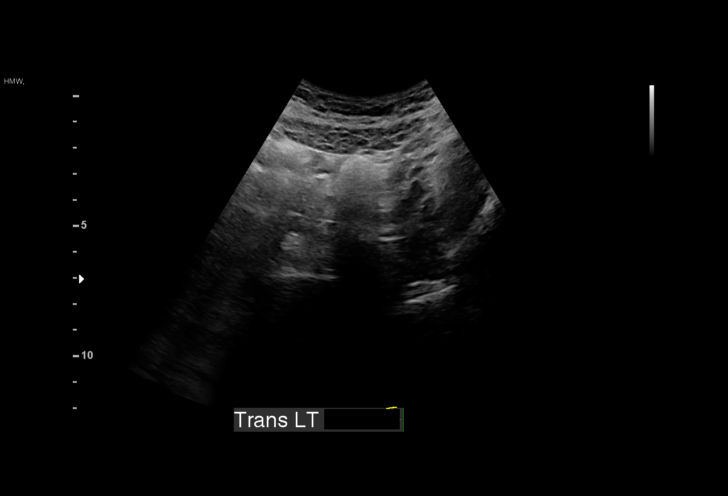
[im 27/61]
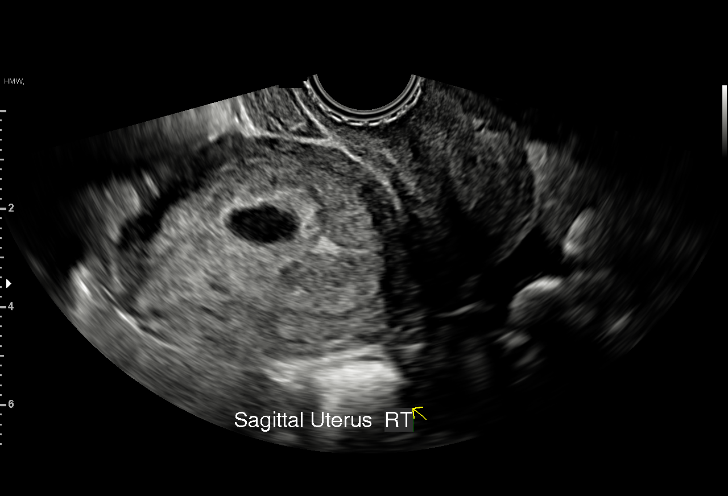
[im 32/61]
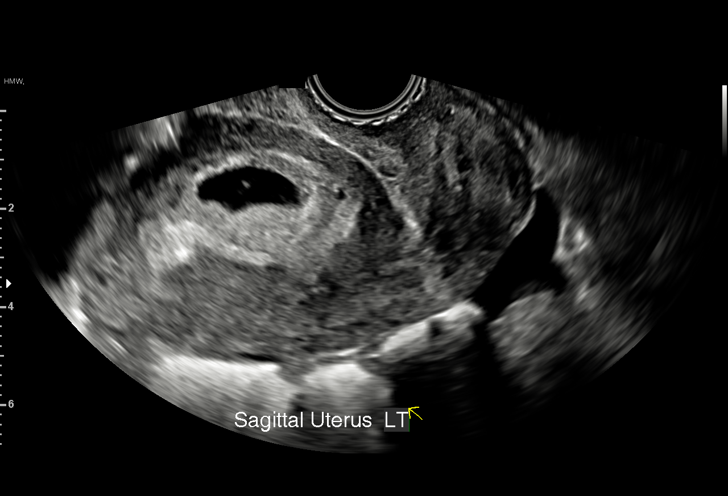
[im 34/61]
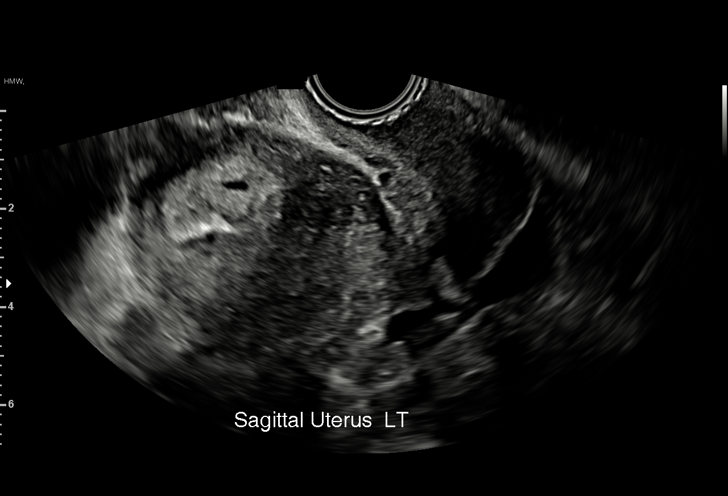
[im 38/61]
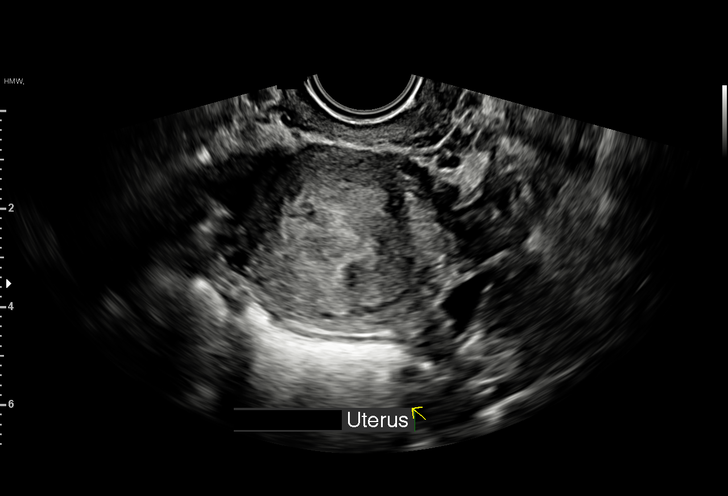
[im 43/61]
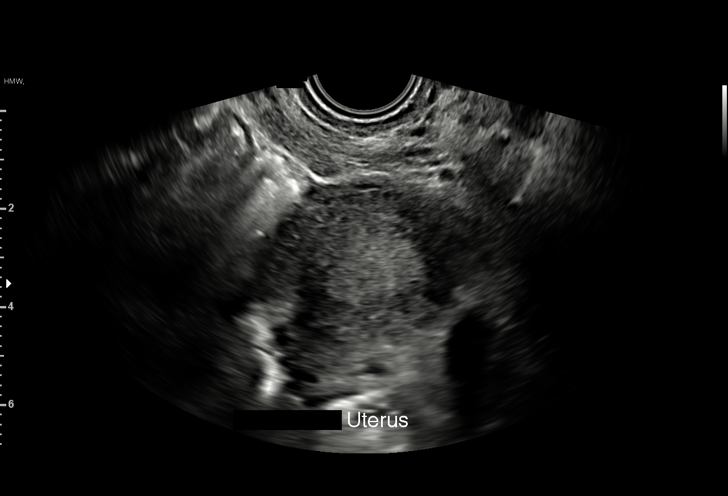
[im 47/61]
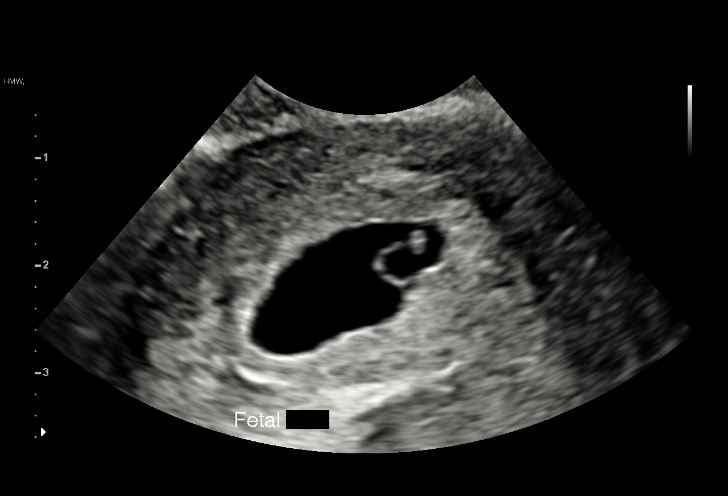
[im 52/61]
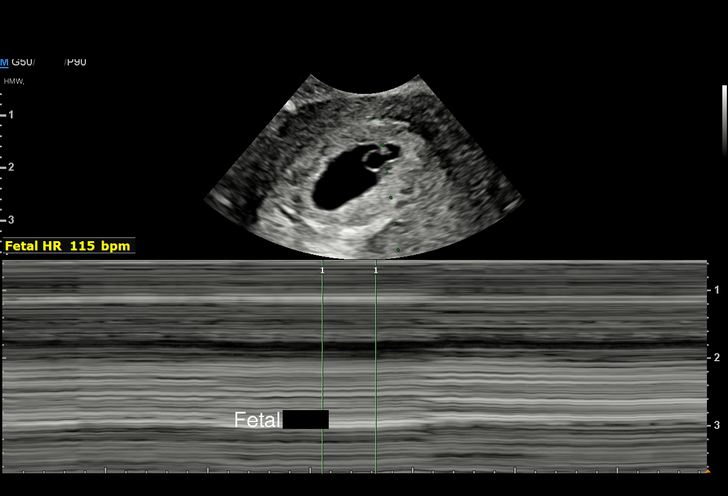
[im 56/61]
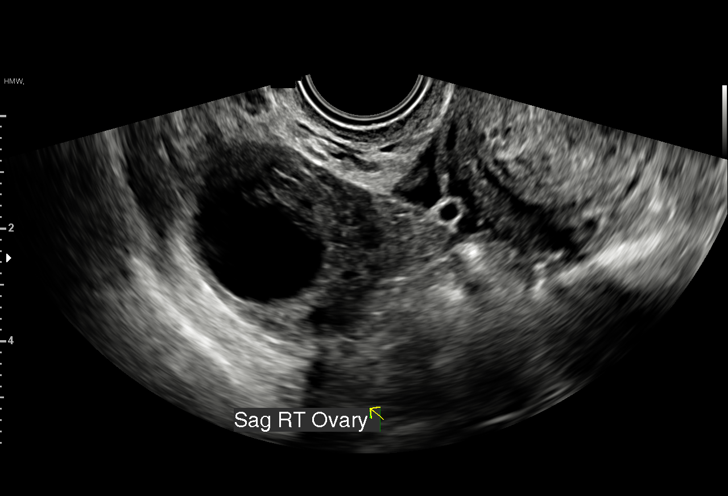
[im 61/61]
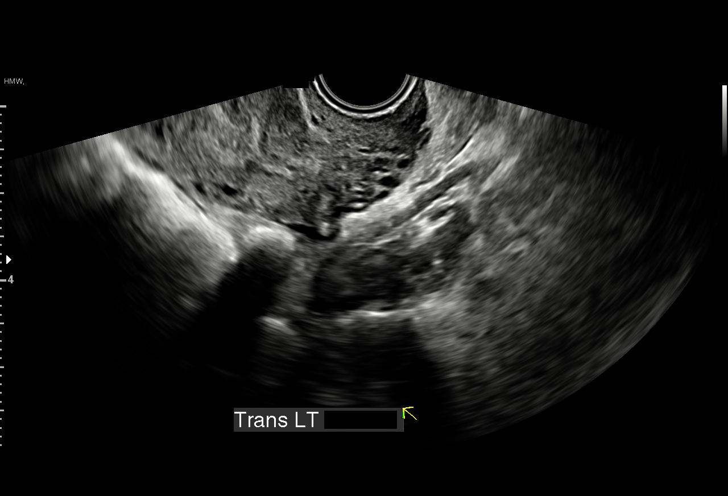

[15 of 28 positions shown; findings below may reference images not displayed]

FINDINGS: Intrauterine gestational sac: Single

Yolk sac:  Visualized

Embryo:  Visualized

Cardiac Activity: Visualized

Heart Rate: 115 bpm

CRL:  2.4 mm   5 w   5 d                  US EDC: 01/10/2022

Subchorionic hemorrhage:  None visualized.

Maternal uterus/adnexae:

Right ovary: Normal containing corpus luteum

Left ovary: Normal

Other :None

Free fluid:  Trace
IMPRESSION: 1. Single living intrauterine gestation with an estimated
gestational age of 5 weeks and 5 days. Note: The gestational age by
ultrasound is discordant with the clinical gestational age of 10
weeks and 2 days.

## 2022-05-12 ENCOUNTER — Other Ambulatory Visit: Payer: Self-pay

## 2022-05-12 DIAGNOSIS — N6323 Unspecified lump in the left breast, lower outer quadrant: Secondary | ICD-10-CM

## 2022-07-03 IMAGING — US US BREAST*L* LIMITED INC AXILLA
1 series · 7 of 7 positions shown · non-contrast
Comparison: Previous exam(s).

CLINICAL DATA: 25-year-old female presenting with a lump in the
left breast and associated pain. The patient states the lump has
been present for a couple of years without change. Patient initially
states the left breast is growing bigger than the right. Patient is
currently in early pregnancy.

EXAM:
ULTRASOUND OF THE LEFT BREAST

[Series 1: us breast*left* limited inc axilla · 0.06mm/px · 7 of 7 slices shown]
[im 1/7]
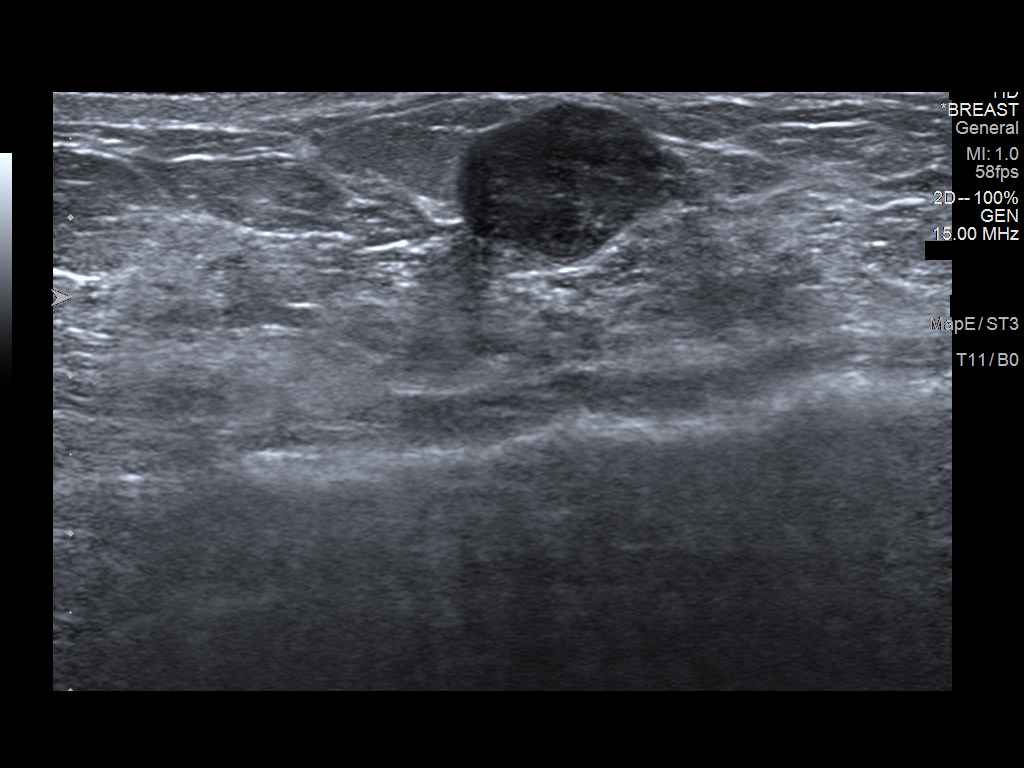
[im 2/7]
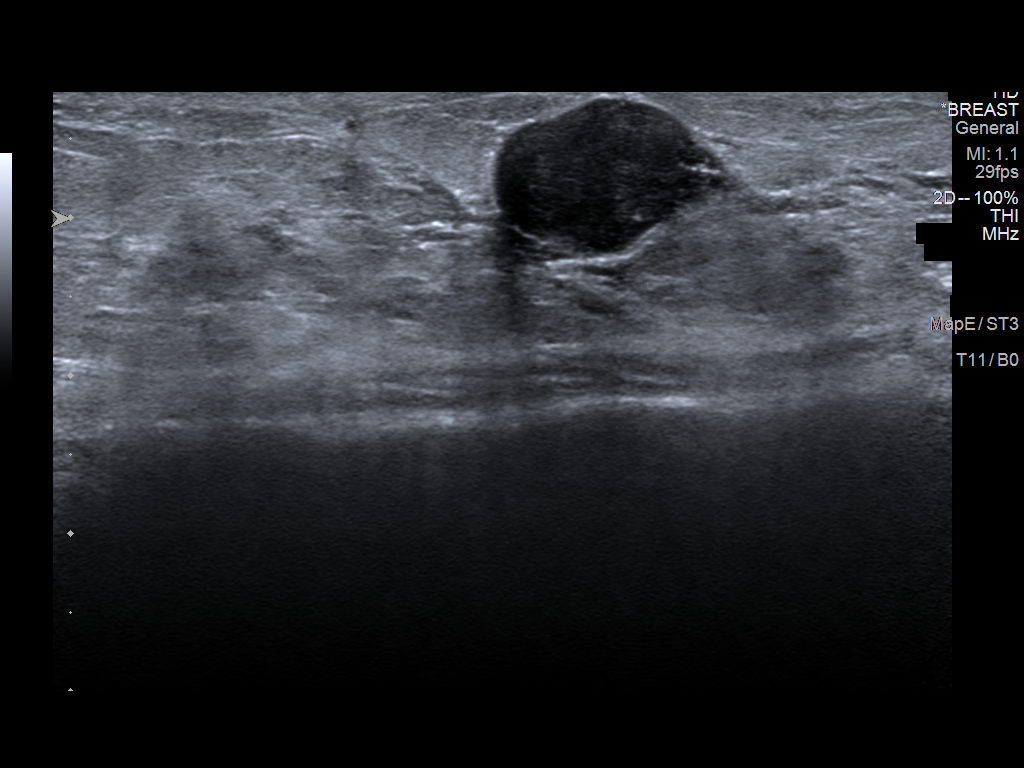
[im 3/7]
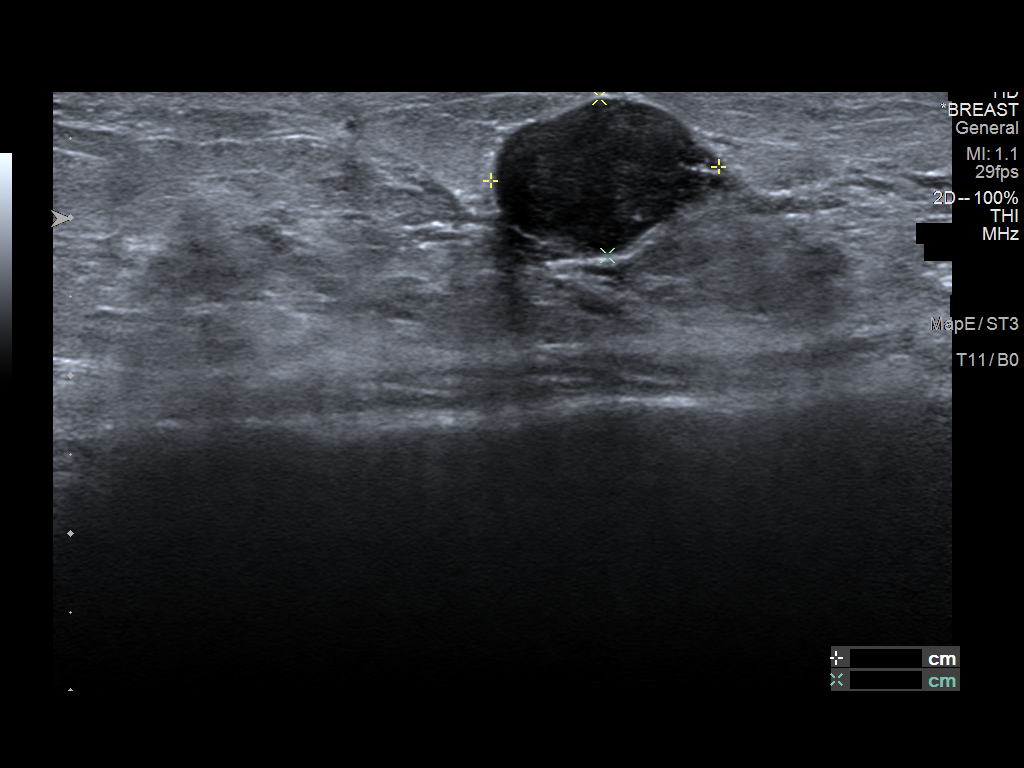
[im 4/7]
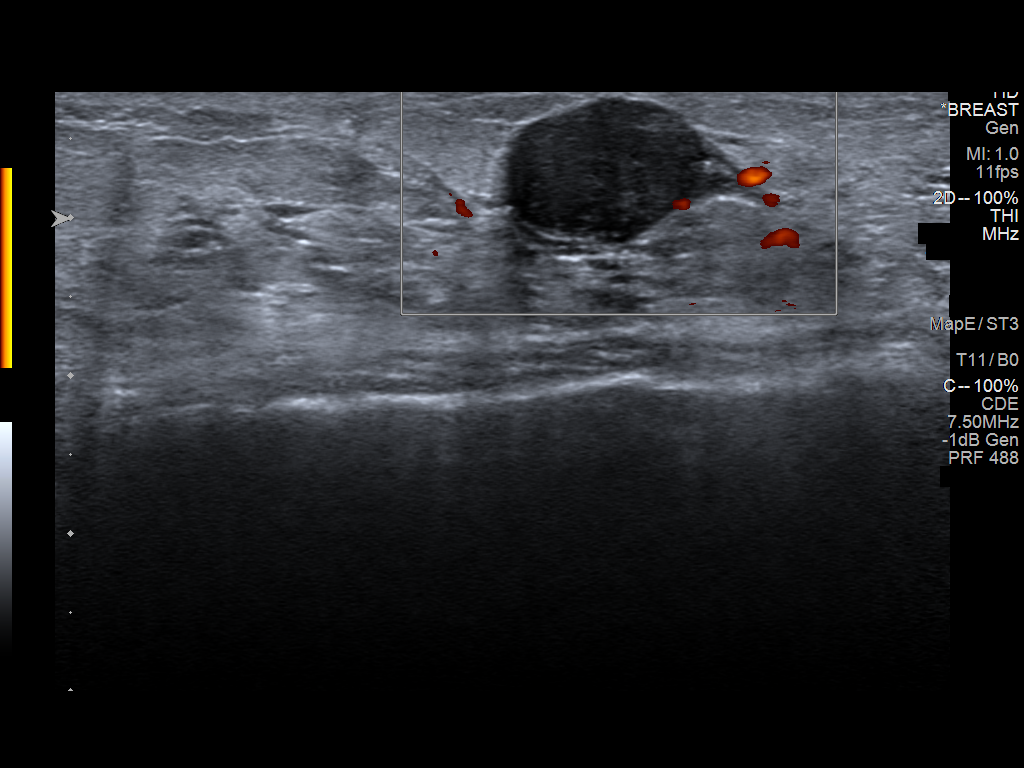
[im 5/7]
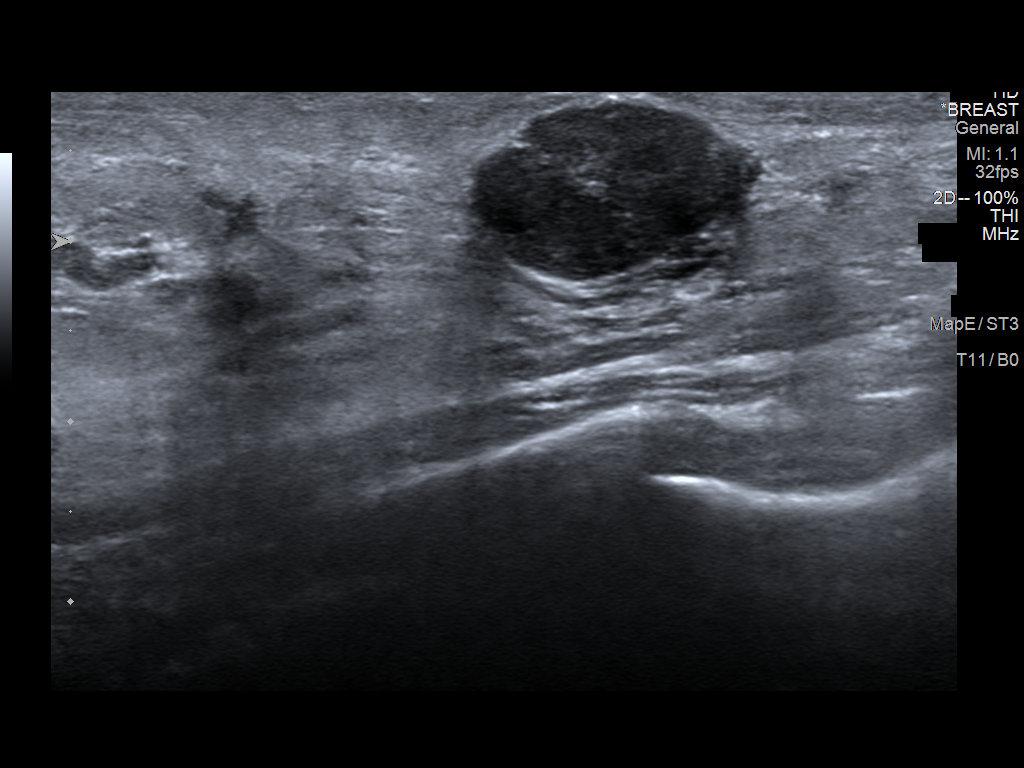
[im 6/7]
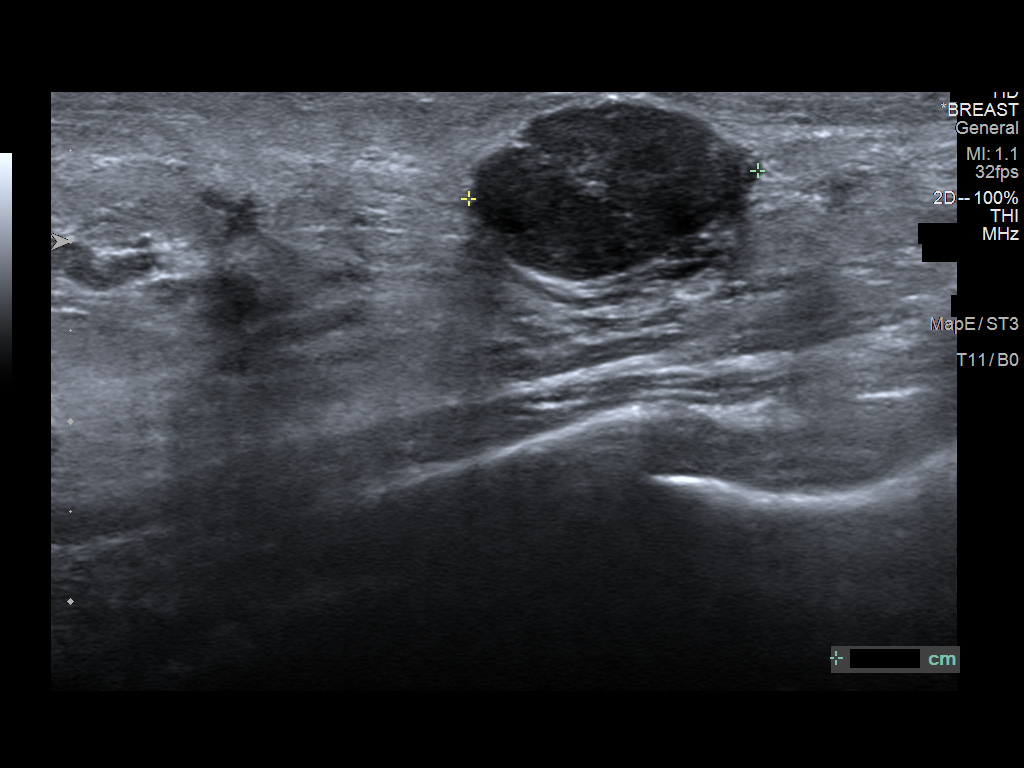
[im 7/7]
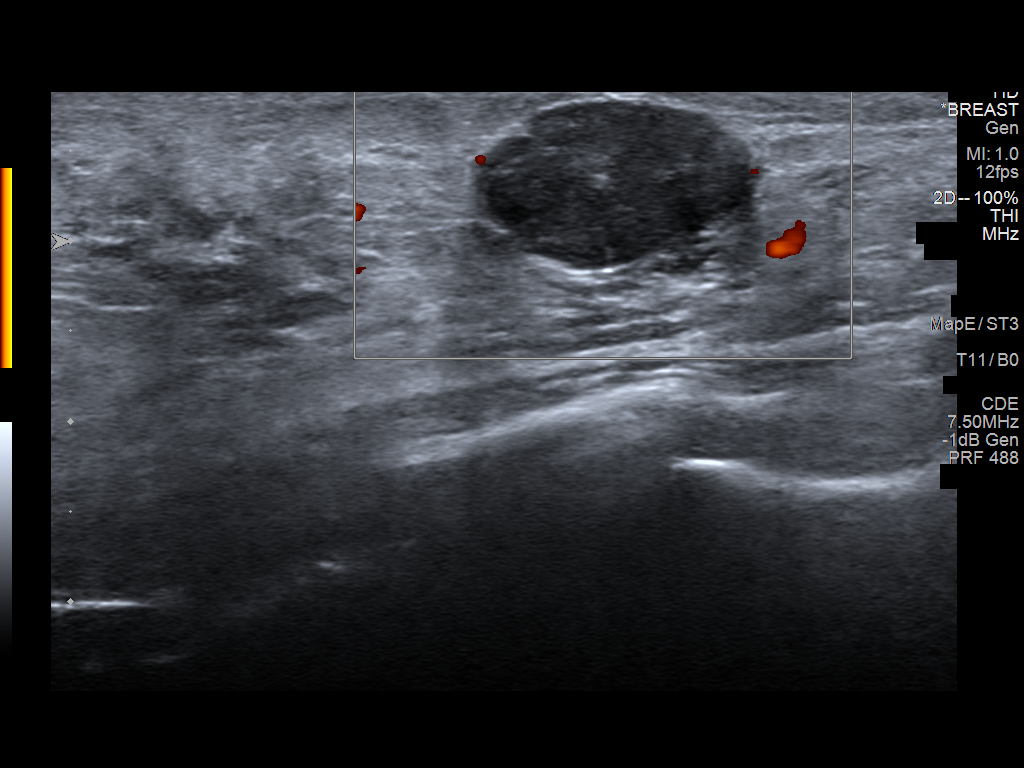

[7 of 7 positions shown; findings below may reference images not displayed]

FINDINGS: On physical exam, I feel a discrete mobile mass at the site of
concern reported by the patient in the lower outer left breast. The
left breast overall does appear slightly larger compared to the
right however there are no skin changes or firmness.

Targeted ultrasound is performed at the site of concern in the left
breast at 4:30 o'clock 6 cm from the nipple demonstrating an oval
circumscribed hypoechoic mass measuring 1.5 x 1.0 x 1.6 cm, most
likely a fibroadenoma.
IMPRESSION: Probably benign mass in the left breast at 4:30 o'clock, likely a
fibroadenoma.

RECOMMENDATION:
Left breast ultrasound in 6 months. Patient was also counseled to
monitor for interval growth at home.

I have discussed the findings and recommendations with the patient.
If applicable, a reminder letter will be sent to the patient
regarding the next appointment.

BI-RADS CATEGORY  3: Probably benign.

## 2022-08-03 ENCOUNTER — Ambulatory Visit: Payer: No Typology Code available for payment source

## 2022-08-03 ENCOUNTER — Other Ambulatory Visit: Payer: No Typology Code available for payment source

## 2023-01-01 IMAGING — US US BREAST*L* LIMITED INC AXILLA
1 series · 4 of 4 positions shown · non-contrast
Comparison: Previous exam(s).

CLINICAL DATA: 26-year-old female, currently 1 month postpartum,
presents for first six-month follow-up of a probably benign left
breast mass.

EXAM:
ULTRASOUND OF THE LEFT BREAST

[Series 1: us breast*left* limited inc axilla · 0.06mm/px · 4 of 4 slices shown]
[im 1/4]
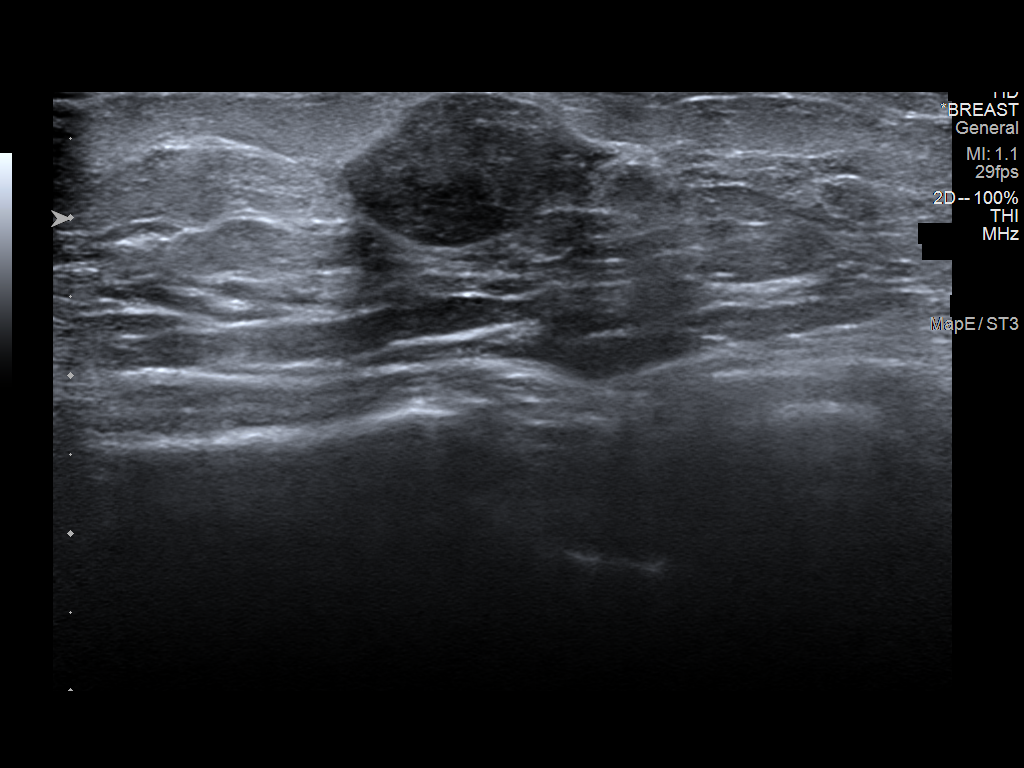
[im 2/4]
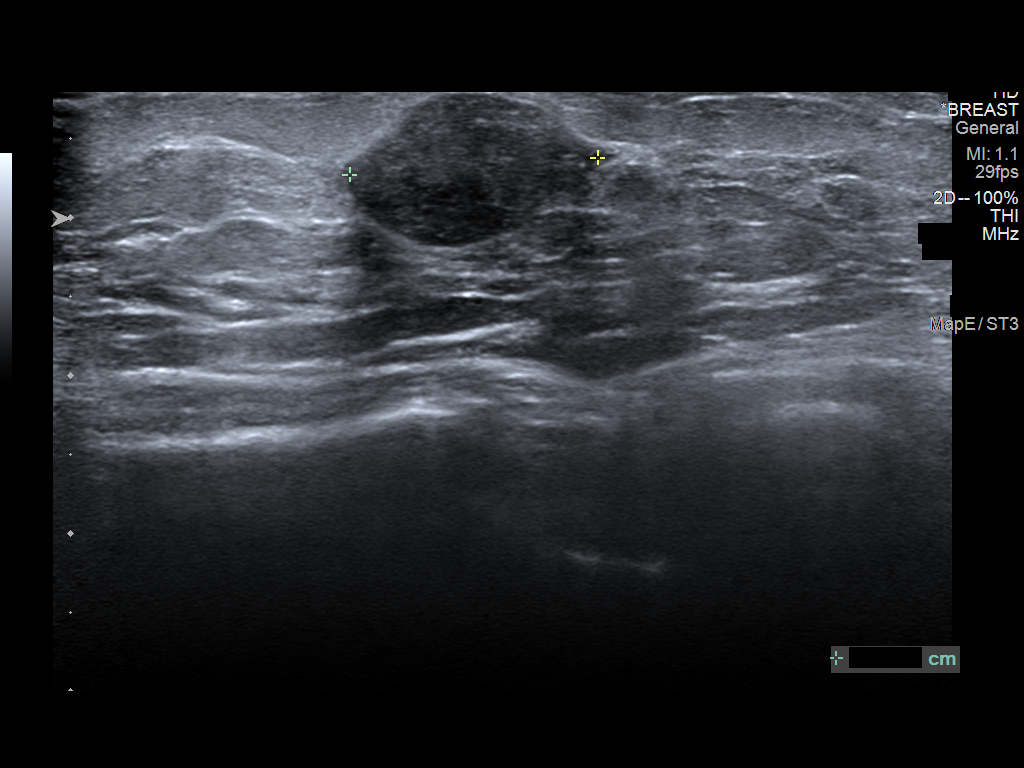
[im 3/4]
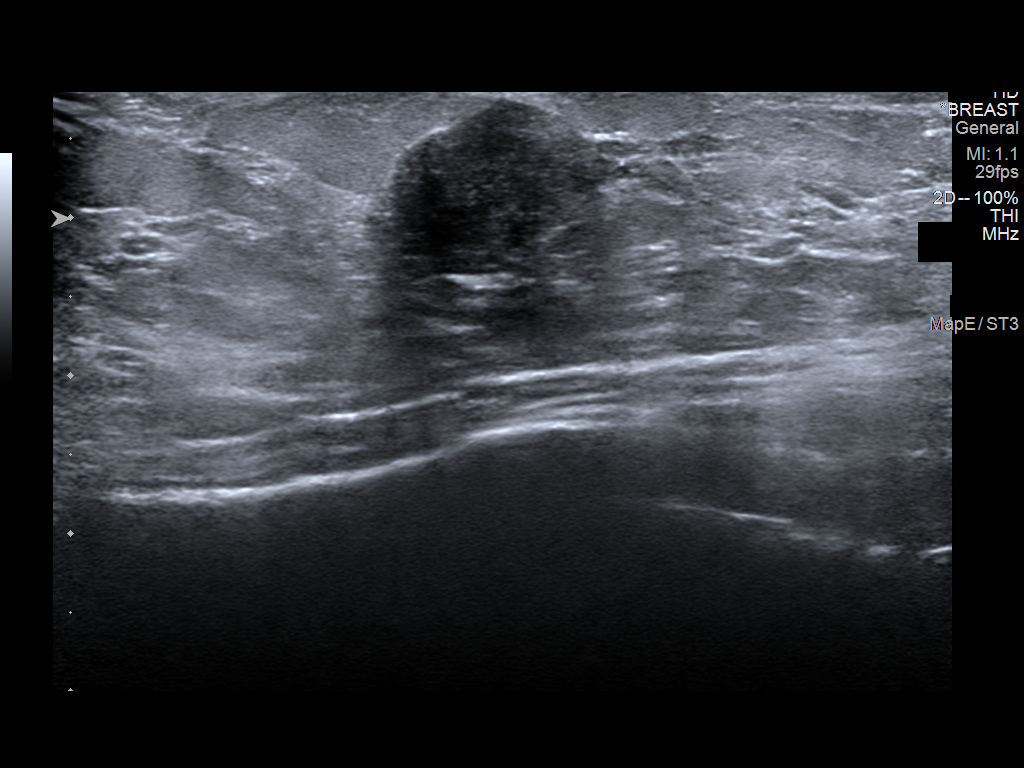
[im 4/4]
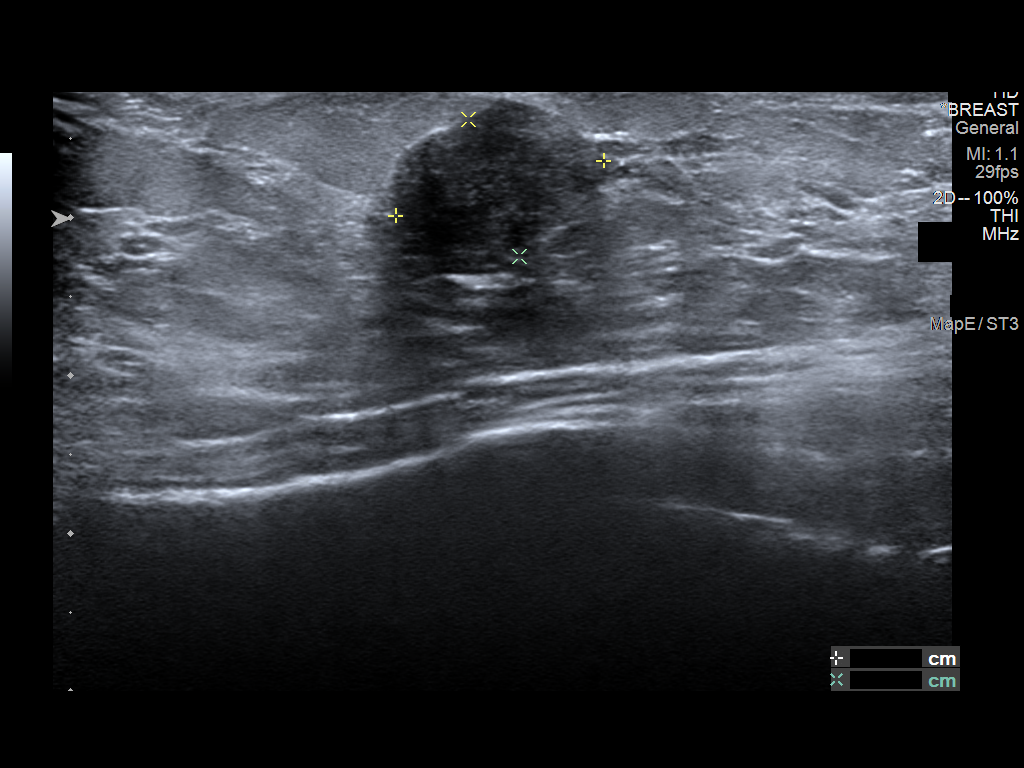

[4 of 4 positions shown; findings below may reference images not displayed]

FINDINGS: Targeted ultrasound is performed, showing stable appearance of a
circumscribed, hypoechoic mass at the [DATE] position 6 cm from the
nipple on the left. Today it measures 1.6 x 0.9 x 1.4 cm (previously
1.6 x 1.0 x 1.5 cm).
IMPRESSION: Stable, probably benign left breast mass. Recommend continued
short-term follow-up.

RECOMMENDATION:
Left breast ultrasound in 6 months.

I have discussed the findings and recommendations with the patient.
If applicable, a reminder letter will be sent to the patient
regarding the next appointment.

BI-RADS CATEGORY  3: Probably benign.

## 2023-01-28 ENCOUNTER — Other Ambulatory Visit: Payer: Self-pay

## 2023-01-28 ENCOUNTER — Emergency Department (HOSPITAL_COMMUNITY): Payer: No Typology Code available for payment source

## 2023-01-28 ENCOUNTER — Emergency Department (HOSPITAL_COMMUNITY)
Admission: EM | Admit: 2023-01-28 | Discharge: 2023-01-28 | Disposition: A | Discharge status: Home / Self Care | Payer: No Typology Code available for payment source | Attending: Emergency Medicine | Admitting: Emergency Medicine

## 2023-01-28 DIAGNOSIS — J029 Acute pharyngitis, unspecified: Secondary | ICD-10-CM

## 2023-01-28 DIAGNOSIS — E039 Hypothyroidism, unspecified: Secondary | ICD-10-CM | POA: Insufficient documentation

## 2023-01-28 DIAGNOSIS — R000 Tachycardia, unspecified: Secondary | ICD-10-CM | POA: Insufficient documentation

## 2023-01-28 DIAGNOSIS — Z1152 Encounter for screening for COVID-19: Secondary | ICD-10-CM | POA: Insufficient documentation

## 2023-01-28 DIAGNOSIS — J039 Acute tonsillitis, unspecified: Secondary | ICD-10-CM | POA: Insufficient documentation

## 2023-01-28 LAB — CBC WITH DIFFERENTIAL/PLATELET
Abs Immature Granulocytes: 0.01 10*3/uL (ref 0.00–0.07)
Basophils Absolute: 0 10*3/uL (ref 0.0–0.1)
Basophils Relative: 0 %
Eosinophils Absolute: 0 10*3/uL (ref 0.0–0.5)
Eosinophils Relative: 0 %
HCT: 45.5 % (ref 36.0–46.0)
Hemoglobin: 14.6 g/dL (ref 12.0–15.0)
Immature Granulocytes: 0 %
Lymphocytes Relative: 23 %
Lymphs Abs: 2.5 10*3/uL (ref 0.7–4.0)
MCH: 27.4 pg (ref 26.0–34.0)
MCHC: 32.1 g/dL (ref 30.0–36.0)
MCV: 85.4 fL (ref 80.0–100.0)
Monocytes Absolute: 0.9 10*3/uL (ref 0.1–1.0)
Monocytes Relative: 8 %
Neutro Abs: 7.2 10*3/uL (ref 1.7–7.7)
Neutrophils Relative %: 69 %
Platelets: 330 10*3/uL (ref 150–400)
RBC: 5.33 MIL/uL — ABNORMAL HIGH (ref 3.87–5.11)
RDW: 13.2 % (ref 11.5–15.5)
WBC: 10.5 10*3/uL (ref 4.0–10.5)
nRBC: 0 % (ref 0.0–0.2)

## 2023-01-28 LAB — BASIC METABOLIC PANEL
Anion gap: 9 (ref 5–15)
BUN: 11 mg/dL (ref 6–20)
CO2: 23 mmol/L (ref 22–32)
Calcium: 8.6 mg/dL — ABNORMAL LOW (ref 8.9–10.3)
Chloride: 102 mmol/L (ref 98–111)
Creatinine, Ser: 0.79 mg/dL (ref 0.44–1.00)
GFR, Estimated: >60 mL/min (ref >60)
Glucose, Bld: 95 mg/dL (ref 70–99)
Potassium: 3.9 mmol/L (ref 3.5–5.1)
Sodium: 134 mmol/L — ABNORMAL LOW (ref 135–145)

## 2023-01-28 LAB — I-STAT BETA HCG BLOOD, ED (MC, WL, AP ONLY): I-stat hCG, quantitative: <5 m[IU]/mL (ref <5)

## 2023-01-28 LAB — GROUP A STREP BY PCR: Group A Strep by PCR: NOT DETECTED

## 2023-01-28 LAB — MONONUCLEOSIS SCREEN: Mono Screen: NEGATIVE

## 2023-01-28 LAB — RESP PANEL BY RT-PCR (RSV, FLU A&B, COVID)  RVPGX2
Influenza A by PCR: NEGATIVE
Influenza B by PCR: NEGATIVE
Resp Syncytial Virus by PCR: NEGATIVE
SARS Coronavirus 2 by RT PCR: NEGATIVE

## 2023-01-28 MED ORDER — DEXAMETHASONE SODIUM PHOSPHATE 10 MG/ML IJ SOLN
10.0000 mg | Freq: Once | INTRAMUSCULAR | Status: AC
  Administered 2023-01-28: 10 mg via INTRAVENOUS
  Filled 2023-01-28: qty 1

## 2023-01-28 MED ORDER — IOHEXOL 300 MG/ML  SOLN
80.0000 mL | Freq: Once | INTRAMUSCULAR | Status: AC | PRN
  Administered 2023-01-28: 80 mL via INTRAVENOUS

## 2023-01-28 MED ORDER — PREDNISONE 10 MG PO TABS: 20.0000 mg | ORAL_TABLET | Freq: Every day | ORAL | 0 refills | Status: AC

## 2023-01-28 MED ORDER — KETOROLAC TROMETHAMINE 15 MG/ML IJ SOLN
15.0000 mg | Freq: Once | INTRAMUSCULAR | Status: AC
Start: 2023-01-28 — End: 2023-01-28
  Administered 2023-01-28: 15 mg via INTRAVENOUS
  Filled 2023-01-28: qty 1

## 2023-01-28 MED ORDER — ACETAMINOPHEN 500 MG PO TABS
1000.0000 mg | ORAL_TABLET | Freq: Once | ORAL | Status: AC
  Administered 2023-01-28: 1000 mg via ORAL
  Filled 2023-01-28: qty 2

## 2023-01-28 MED ORDER — SODIUM CHLORIDE (PF) 0.9 % IJ SOLN
INTRAMUSCULAR | Status: AC
  Filled 2023-01-28: qty 50

## 2023-01-28 MED ORDER — PENICILLIN V POTASSIUM 500 MG PO TABS: 500.0000 mg | ORAL_TABLET | Freq: Two times a day (BID) | ORAL | 0 refills | Status: AC

## 2023-01-28 MED ORDER — PENICILLIN V POTASSIUM 500 MG PO TABS
500.0000 mg | ORAL_TABLET | Freq: Once | ORAL | Status: AC
  Administered 2023-01-28: 500 mg via ORAL
  Filled 2023-01-28: qty 1

## 2023-01-28 NOTE — Discharge Instructions (Signed)
Thank you for letting us take care of you today.  Overall, your workup was reassuring. You were negative for mono, COVID, RSV, and the flu. Your strep swab was also negative, however, as discussed these tests are often falsely negative and your physical exam and presentation is consistent with strep. The CT scan of your throat did not show a significant abscess or other large collection of infection or obstruction of your airway. With this, we will treat you for a likely bacterial cause of pharyngitis / tonsillitis. I gave you your first dose of antibiotics and a dose of pain medication and steroids in the ED today. Please start your prescription medications tomorrow morning.   Please follow up with your PCP if you are not noticing significant improvement by the beginning of next week. For any worsening symptoms such as difficulty tolerating your saliva, difficulty swallowing, difficulty breathing, or other new, concerning symptoms, please return to the nearest emergency department for re-evaluation.   Gracias por dejarnos cuidar de usted hoy.  En general, su anlisis fue tranquilizador. Dio negativo en mono, COVID, RSV y gripe. Su hisopo para estreptococos tambin fue negativo; sin embargo, como se mencion anteriormente, estas pruebas a menudo son falsamente negativas y su 39 fsico y su presentacin son consistentes con estreptococos. La tomografa computarizada de su garganta no mostr un absceso significativo u otra gran acumulacin de infeccin u obstruccin de las vas respiratorias. Con esto, lo trataremos de una probable causa bacteriana de faringitis/amigdalitis. Le di su primera dosis de antibiticos y Ardelia Mems dosis de analgsicos y esteroides hoy en el servicio de urgencias. Por favor comience sus medicamentos recetados maana por la maana.  Haga un seguimiento con su PCP si no nota una mejora significativa a principios de la prxima semana. Si cualquier sntoma empeora, como dificultad para  tolerar la saliva, dificultad para tragar, dificultad para respirar u otros sntomas nuevos y preocupantes, regrese al departamento de emergencias ms cercano para una nueva evaluacin.

## 2023-01-28 NOTE — ED Notes (Signed)
Patient is in CT. Unable to get vitals at this time.

## 2023-01-28 NOTE — ED Triage Notes (Signed)
Pt reports sore throat, fever and swollen/bleeding gums. Pt states pcp sent her to ED to get tested for Mono and have her platelets checked.

## 2023-01-28 NOTE — ED Provider Notes (Signed)
[place] [facility] Provider Note   CSN: [id number] Arrival date & time: 01/28/23  1725     History  Chief Complaint  Patient presents with   Sore Throat    Stacey Tapia is a 27 y.o. female with past medical history hypothyroidism who presents to the ED complaining of a sore throat and difficulty swallowing for the past 4 days.  Patient states that she was evaluated at her PCP and had a negative strep swab and was sent to the ED to have blood work and a Monospot completed.  She has not had a history of mono.  She also notes that she has developed a rash to her chest and her PCP wanted her platelets checked.  She states that she has had a fever at home that she has been treating with Tylenol.  Last Tylenol dose was early this morning. She does share drinks/food with her young children. She denies chest pain, SOB, nausea, vomiting, diarrhea. No history of autoimmune disease. Notes she has felt like her gums are irritated and they have had minor bleeding.       Home Medications None  Allergies    Cough syrup [guaifenesin]    Review of Systems   Review of Systems  All other systems reviewed and are negative.   Physical Exam Updated Vital Signs BP 109/81   Pulse 91   Temp 99 F (37.2 C) (Oral)   Resp 18   Ht 5\' 4"  (1.626 m)   Wt 75.3 kg   SpO2 100%   BMI 28.49 kg/m  Physical Exam Vitals and nursing note reviewed.  Constitutional:      General: She is not in acute distress.    Appearance: Normal appearance. She is not toxic-appearing.  HENT:     Head: Normocephalic and atraumatic.     Jaw: There is normal jaw occlusion.     Nose:     Right Turbinates: Not enlarged, swollen or pale.     Left Turbinates: Not enlarged, swollen or pale.     Right Sinus: No maxillary sinus tenderness or frontal sinus tenderness.     Left Sinus: No maxillary sinus tenderness or frontal sinus tenderness.     Mouth/Throat:     Lips: No lesions.      Mouth: Mucous membranes are moist. No oral lesions.     Dentition: Normal dentition. No gingival swelling, dental caries, dental abscesses or gum lesions.     Tongue: No lesions. Tongue does not deviate from midline.     Pharynx: Uvula midline. Oropharyngeal exudate (bilateral tonsils) and posterior oropharyngeal erythema (moderate posterior pharyngeal) present. No uvula swelling.     Tonsils: 2+ on the right. 3+ on the left.     Comments: Airway is patent but there is significant tonsillar swelling particularly on the left, patient tolerating secretions without difficulty, speaking in full sentences, no signs of respiratory distress or airway compromise Eyes:     Conjunctiva/sclera: Conjunctivae normal.  Cardiovascular:     Rate and Rhythm: Regular rhythm. Tachycardia present.     Heart sounds: No murmur heard. Pulmonary:     Effort: Pulmonary effort is normal. No respiratory distress.     Breath sounds: Normal breath sounds. No stridor. No wheezing, rhonchi or rales.  Chest:     Chest wall: No tenderness.  Abdominal:     General: Abdomen is flat.     Palpations: Abdomen is soft.     Tenderness: There is  no abdominal tenderness.  Musculoskeletal:        General: Normal range of motion.     Cervical back: Normal range of motion and neck supple.     Right lower leg: No edema.     Left lower leg: No edema.  Lymphadenopathy:     Cervical: Cervical adenopathy (a few small scattered mobile anterior cervical nodes) present.  Skin:    General: Skin is warm and dry.     Capillary Refill: Capillary refill takes less than 2 seconds.     Comments: A few small pinpoint erythematous papules to anterior chest  Neurological:     General: No focal deficit present.     Mental Status: She is alert and oriented to person, place, and time. Mental status is at baseline.  Psychiatric:        Behavior: Behavior normal.     ED Results / Procedures / Treatments   Labs (all labs ordered are  listed, but only abnormal results are displayed) Labs Reviewed  CBC WITH DIFFERENTIAL/PLATELET - Abnormal; Notable for the following components:      Result Value   RBC 5.33 (*)    All other components within normal limits  BASIC METABOLIC PANEL - Abnormal; Notable for the following components:   Sodium 134 (*)    Calcium 8.6 (*)    All other components within normal limits  GROUP A STREP BY PCR  RESP PANEL BY RT-PCR (RSV, FLU A&B, COVID)  [id number]  MONONUCLEOSIS SCREEN  I-STAT BETA HCG BLOOD, ED ([facility], [facility], [facility] ONLY)    EKG None  Radiology CT Soft Tissue Neck W Contrast  Result Date: 01/28/2023 CLINICAL DATA:  Initial evaluation for epiglottitis or tonsillitis. EXAM: CT NECK WITH CONTRAST TECHNIQUE: Multidetector CT imaging of the neck was performed using the standard protocol following the bolus administration of intravenous contrast. RADIATION DOSE REDUCTION: This exam was performed according to the departmental dose-optimization program which includes automated exposure control, adjustment of the mA and/or kV according to patient size and/or use of iterative reconstruction technique. CONTRAST:  71mL OMNIPAQUE IOHEXOL 300 MG/ML  SOLN COMPARISON:  None Available. FINDINGS: Pharynx and larynx: Oral cavity within normal limits. No acute inflammatory changes seen about the dentition. Palatine tonsils are hypertrophied and hyperenhancing, consistent with acute tonsillitis. No discrete tonsillar or peritonsillar abscess. Adenoidal soft tissues somewhat prominent as are the lingual tonsils. Associated mild mucosal edema within the oropharynx. Epiglottis itself within normal limits. Vallecula is effaced by the hypertrophied lingual tonsils. No retropharyngeal collection. Remainder of the hypopharynx and supraglottic larynx within normal limits. Glottis symmetric and normal. Subglottic airway patent clear. Salivary glands: Salivary glands including the parotid and submandibular glands are within normal  limits. Thyroid: Normal. Lymph nodes: Prominent upper cervical lymph nodes, largest of which measures 1.7 cm in short axis at left level 2, presumably reactive. Vascular: Normal intravascular enhancement seen throughout the neck. Limited intracranial: Unremarkable. Visualized orbits: Unremarkable. Mastoids and visualized paranasal sinuses: Paranasal sinuses are largely clear. Mastoid air cells and middle ear cavities are well pneumatized and free of fluid. Skeleton: No discrete or worrisome osseous lesions. Upper chest: Visualized upper chest demonstrates no acute finding. Other: None. IMPRESSION: 1. Findings consistent with acute tonsillitis/pharyngitis. No discrete tonsillar or peritonsillar abscess. 2. Prominent upper cervical lymph nodes, presumably reactive. Electronically Signed   By: Jeannine Boga M.D.   On: 01/28/2023 20:33    Procedures Procedures    Medications Ordered in ED Medications  sodium chloride (PF) 0.9 % injection (  has no administration in time range)  acetaminophen (TYLENOL) tablet 1,000 mg (1,000 mg Oral Given 01/28/23 1853)  iohexol (OMNIPAQUE) 300 MG/ML solution 80 mL (80 mLs Intravenous Contrast Given 01/28/23 2010)  dexamethasone (DECADRON) injection 10 mg (10 mg Intravenous Given 01/28/23 2220)  ketorolac (TORADOL) 15 MG/ML injection 15 mg (15 mg Intravenous Given 01/28/23 2220)  penicillin v potassium (VEETID) tablet 500 mg (500 mg Oral Given 01/28/23 2220)    ED Course/ Medical Decision Making/ A&P                             Medical Decision Making Amount and/or Complexity of Data Reviewed Labs: ordered. Decision-making details documented in ED Course. Radiology: ordered. Decision-making details documented in ED Course.  Risk OTC drugs. Prescription drug management.   Medical Decision Making:   Aelita Selwyn is a 27 y.o. female who presented to the ED today with sore throat detailed above.    Additional history discussed with patient's  family/caregivers.  Complete initial physical exam performed, notably the patient had tonsillar exudates and swelling with swollen anterior cervical lymph nodes.  She was tolerating secretions without difficulty and in no respiratory distress. Reviewed and confirmed nursing documentation for past medical history, family history, social history.    Initial Assessment:   With the patient's presentation of sore throat, most likely diagnosis is strep pharyngitis. Differential diagnosis includes but is not limited to tonsillitis, viral pharyngitis, mononucleosis, Ludwig's angina, peritonsillar abscess, retropharyngeal abscess, viral syndrome, sinusitis, pneumonia.  This is most consistent with an acute complicated illness  Initial Plan:  Screening labs including CBC and Metabolic panel to evaluate for infectious or metabolic etiology of disease.  Viral swabs Strep / mono swabs CT soft tissue neck Tylenol for fever treatment Decadron for symptomatic treatment Objective evaluation as reviewed   Initial Study Results:   Laboratory  All laboratory results reviewed without evidence of clinically relevant pathology.   Exceptions include: Na 134   Radiology:  All images reviewed independently. Agree with radiology report at this time.   CT Soft Tissue Neck W Contrast  Result Date: 01/28/2023 CLINICAL DATA:  Initial evaluation for epiglottitis or tonsillitis. EXAM: CT NECK WITH CONTRAST TECHNIQUE: Multidetector CT imaging of the neck was performed using the standard protocol following the bolus administration of intravenous contrast. RADIATION DOSE REDUCTION: This exam was performed according to the departmental dose-optimization program which includes automated exposure control, adjustment of the mA and/or kV according to patient size and/or use of iterative reconstruction technique. CONTRAST:  56mL OMNIPAQUE IOHEXOL 300 MG/ML  SOLN COMPARISON:  None Available. FINDINGS: Pharynx and larynx: Oral cavity  within normal limits. No acute inflammatory changes seen about the dentition. Palatine tonsils are hypertrophied and hyperenhancing, consistent with acute tonsillitis. No discrete tonsillar or peritonsillar abscess. Adenoidal soft tissues somewhat prominent as are the lingual tonsils. Associated mild mucosal edema within the oropharynx. Epiglottis itself within normal limits. Vallecula is effaced by the hypertrophied lingual tonsils. No retropharyngeal collection. Remainder of the hypopharynx and supraglottic larynx within normal limits. Glottis symmetric and normal. Subglottic airway patent clear. Salivary glands: Salivary glands including the parotid and submandibular glands are within normal limits. Thyroid: Normal. Lymph nodes: Prominent upper cervical lymph nodes, largest of which measures 1.7 cm in short axis at left level 2, presumably reactive. Vascular: Normal intravascular enhancement seen throughout the neck. Limited intracranial: Unremarkable. Visualized orbits: Unremarkable. Mastoids and visualized paranasal sinuses: Paranasal sinuses are largely clear. Mastoid air  cells and middle ear cavities are well pneumatized and free of fluid. Skeleton: No discrete or worrisome osseous lesions. Upper chest: Visualized upper chest demonstrates no acute finding. Other: None. IMPRESSION: 1. Findings consistent with acute tonsillitis/pharyngitis. No discrete tonsillar or peritonsillar abscess. 2. Prominent upper cervical lymph nodes, presumably reactive. Electronically Signed   By: Jeannine Boga M.D.   On: 01/28/2023 20:33      Final Assessment and Plan:   This is a 27 year old female presenting to ED for sore throat for the last 4 days. Pt had negative strep swab at PCP. She was sent to ED for further evaluation. On exam, pt does have notable tonsillar swelling and exudates but airway is patent and she is tolerating secretions without difficulty. She has multiple swollen anterior cervical lymph nodes.  Febrile and tachycardic on arrival but this resolved with Tylenol and otherwise vital signs stable. Workup obtained as above for further assessment. Of note, pt states she has had bleeding from her gums but no other unexplained bleeding, bruising, or history of bleeding disorders. No significant swelling, erythema, or lesions to gums and no active bleeding. Workup overall reassuring with negative mono, negative viral swabs. Pt has no leukocytosis, no significant electrolyte disturbance, unremarkable CBC. Strep again here is negative. CT neck shows likely pharyngitis / tonsillitis but no abscess or other significant finding. No PTA, retropharyngeal abscess. Though strep is negative, with exudates, fever, significant tonsillar swelling, I do suspect bacterial source as cause of pt's symptoms and she does meet [facility] criteria for likelihood of strep. With this, will go ahead and treat for bacterial pharyngitis with penicillin as prescribed and will give short course of steroids as well. First dose antibiotics, Decadron given in ED for treatment in addition to Toradol for pain control. Pt instructed to follow up closely with PCP. Strict ED return precautions given, all questions answered, and pt stable for discharge.    Clinical Impression:  1. Tonsillitis with exudate   2. Pharyngitis, unspecified etiology      Discharge           Final Clinical Impression(s) / ED Diagnoses Final diagnoses:  Pharyngitis, unspecified etiology  Tonsillitis with exudate    Rx / DC Orders ED Discharge Orders          Ordered    penicillin v potassium (VEETID) 500 MG tablet  2 times daily        01/28/23 2205    predniSONE (DELTASONE) 10 MG tablet  Daily with breakfast        01/28/23 2205              Thella, Mcclaine, PA-C 01/28/23 2335    Drenda Freeze, MD 01/29/23 1459

## 2023-07-12 ENCOUNTER — Encounter: Payer: No Typology Code available for payment source | Admitting: Physical Therapy

## 2023-11-17 ENCOUNTER — Emergency Department (HOSPITAL_COMMUNITY)
Admission: EM | Admit: 2023-11-17 | Discharge: 2023-11-17 | Disposition: A | Discharge status: Home / Self Care | Payer: No Typology Code available for payment source | Attending: Emergency Medicine | Admitting: Emergency Medicine

## 2023-11-17 DIAGNOSIS — H5711 Ocular pain, right eye: Secondary | ICD-10-CM | POA: Insufficient documentation

## 2023-11-17 DIAGNOSIS — R09A2 Foreign body sensation, throat: Secondary | ICD-10-CM | POA: Insufficient documentation

## 2023-11-17 MED ORDER — TETRACAINE HCL 0.5 % OP SOLN
2.0000 [drp] | Freq: Once | OPHTHALMIC | Status: AC
  Administered 2023-11-17: 2 [drp] via OPHTHALMIC
  Filled 2023-11-17: qty 4

## 2023-11-17 MED ORDER — FLUORESCEIN SODIUM 1 MG OP STRP
1.0000 | ORAL_STRIP | Freq: Once | OPHTHALMIC | Status: AC
  Administered 2023-11-17: 1 via OPHTHALMIC
  Filled 2023-11-17: qty 1

## 2023-11-17 MED ORDER — POLYMYXIN B-TRIMETHOPRIM 10000-0.1 UNIT/ML-% OP SOLN: 2.0000 [drp] | OPHTHALMIC | 0 refills | Status: AC

## 2023-11-17 NOTE — Discharge Instructions (Addendum)
 No obvious glass noted on exam. Please continue to flush your eye once a day and follow up with ophthalmology. Let them know you were evaluated in ED.  Return to ED if significant worsening of symptoms

## 2023-11-17 NOTE — ED Triage Notes (Signed)
 Pt was changing a phone screen glass when a piece got in her eye. Attempted to rinse it out, feels like there is still something in there. Eye is red, vision slightly blurry.

## 2023-11-17 NOTE — ED Provider Notes (Signed)
 [facility] [facility] Provider Note   CSN: [id number] Arrival date & time: 11/17/23  1412     History  Chief Complaint  Patient presents with   Eye Pain    Stacey Tapia is a 28 y.o. female with no significant PMHx presents to ED for evaluation of glass in her eye. She reports that she was not wearing eye protection when attempting to change phone glass screen protector when it broke. She attempted to flush eye at home and noted some glass coming from her eye. She continues to complain of right eye pain mostly near tear duct with associated mildly blurred vision. Does not wear contact lens.   Eye Pain Pertinent negatives include no chest pain, no abdominal pain, no headaches and no shortness of breath.     Home Medications Prior to Admission medications   Medication Sig Start Date End Date Taking? Authorizing Provider  trimethoprim -polymyxin b  (POLYTRIM ) ophthalmic solution Place 2 drops into the right eye every 4 (four) hours. 11/17/23  Yes Minnie Tinnie BRAVO, PA      Allergies    Cough syrup [guaifenesin]    Review of Systems   Review of Systems  Constitutional:  Negative for chills, fatigue and fever.  Eyes:  Positive for pain.  Respiratory:  Negative for cough, chest tightness, shortness of breath and wheezing.   Cardiovascular:  Negative for chest pain and palpitations.  Gastrointestinal:  Negative for abdominal pain, constipation, diarrhea, nausea and vomiting.  Neurological:  Negative for dizziness, seizures, weakness, light-headedness, numbness and headaches.    Physical Exam Updated Vital Signs BP 126/75 (BP Location: Right Arm)   Pulse 98   Temp 98.6 F (37 C) (Oral)   Resp 16   SpO2 98%  Physical Exam Vitals and nursing note reviewed.  Constitutional:      General: She is not in acute distress.    Appearance: Normal appearance.  HENT:     Head: Normocephalic and atraumatic.  Eyes:     General: Lids are everted,  no foreign bodies appreciated.        Right eye: No discharge.        Left eye: No discharge.     Extraocular Movements:     Right eye: Normal extraocular motion and no nystagmus.     Left eye: Normal extraocular motion and no nystagmus.     Conjunctiva/sclera:     Right eye: Right conjunctiva is injected. No chemosis, exudate or hemorrhage.    Left eye: Left conjunctiva is not injected. No chemosis, exudate or hemorrhage.    Pupils: Pupils are equal, round, and reactive to light.     Right eye: No corneal abrasion or fluorescein  uptake. Seidel exam negative.     Slit lamp exam:    Right eye: No photophobia.     Comments: No obvious FB nor fluorescein  uptake noted on fluorescein  exam. No break in skin integrity nor periorbital swelling  Cardiovascular:     Rate and Rhythm: Normal rate.  Pulmonary:     Effort: Pulmonary effort is normal. No respiratory distress.  Skin:    Capillary Refill: Capillary refill takes less than 2 seconds.     Coloration: Skin is not jaundiced or pale.  Neurological:     Mental Status: She is alert and oriented to person, place, and time. Mental status is at baseline.    ED Results / Procedures / Treatments   Labs (all labs ordered are listed, but only abnormal  results are displayed) Labs Reviewed - No data to display  EKG None  Radiology No results found.  Procedures Procedures    Medications Ordered in ED Medications  tetracaine  (PONTOCAINE) 0.5 % ophthalmic solution 2 drop (has no administration in time range)  fluorescein  ophthalmic strip 1 strip (has no administration in time range)    ED Course/ Medical Decision Making/ A&P                                 Medical Decision Making Risk Prescription drug management.   Patient presents to the ED for concern of eye pain, this involves an extensive number of treatment options, and is a complaint that carries with it a high risk of complications and morbidity.  The differential diagnosis  includes abrasion, ulcer, infection, laceration, eye defect.   Co morbidities that complicate the patient evaluation  None   Additional history obtained:  Additional history obtained from Nursing and Outside Medical Records   External records from outside source obtained and reviewed including  Triage rn note Recent med evals and medication list    Medicines ordered and prescription drug management:  I ordered medication including polytrim   for infection prophylaxis  Reevaluation of the patient after these medicines showed that the patient improved I have reviewed the patients home medicines and have made adjustments as needed    Problem List / ED Course:  Globus sensation Right eye pain Attempted to flush eye in ED with some relief to pain No fluorescein  uptake but could still have a small abrasion - will provide polytrim  drops to cover for infection No obvious FB Optho f/u   Reevaluation:  After the interventions noted above, I reevaluated the patient and found that they have :improved    Dispostion:  After consideration of the diagnostic results and the patients response to treatment, I feel that the patent would benefit from outpatient management with Optho f/u.   Discussed patient, findings with Dr Dean who agrees with plan Final Clinical Impression(s) / ED Diagnoses Final diagnoses:  Globus sensation  Pain of right eye    Rx / DC Orders ED Discharge Orders          Ordered    trimethoprim -polymyxin b  (POLYTRIM ) ophthalmic solution  Every 4 hours        11/17/23 1610              Minnie Tinnie BRAVO, PA 11/17/23 1616    Dean Clarity, MD 11/17/23 1619

## 2024-01-23 ENCOUNTER — Ambulatory Visit: Payer: Self-pay

## 2024-01-23 ENCOUNTER — Ambulatory Visit (LOCAL_COMMUNITY_HEALTH_CENTER): Payer: Self-pay

## 2024-01-23 DIAGNOSIS — Z719 Counseling, unspecified: Secondary | ICD-10-CM

## 2024-01-23 DIAGNOSIS — Z23 Encounter for immunization: Secondary | ICD-10-CM

## 2024-01-23 NOTE — Progress Notes (Signed)
 Pt in nurse clinic requesting 2nd dose Varicella vaccine required for Immigration. Eligible per [facility], pt paid Varicella vaccine. Given VIS, administered and tolerated well. Given [facility] copy, explained and understood. M.Caramia Boutin, LPN.
